# Patient Record
Sex: Male | Born: 1960 | Race: White | Hispanic: No | Marital: Married | Smoking: Former smoker
Health system: Southern US, Community
[De-identification: ages and names within clinical notes are randomized; demographics above are authoritative.]

## PROBLEM LIST (undated history)

## (undated) HISTORY — PX: SHOULDER SURGERY: SHX246

---

## 2000-07-23 ENCOUNTER — Emergency Department (HOSPITAL_COMMUNITY): Admission: EM | Admit: 2000-07-23 | Discharge: 2000-07-23 | Payer: Self-pay | Admitting: Emergency Medicine

## 2000-09-10 ENCOUNTER — Encounter: Payer: Self-pay | Admitting: Family Medicine

## 2000-09-10 ENCOUNTER — Encounter: Admission: RE | Admit: 2000-09-10 | Discharge: 2000-09-10 | Payer: Self-pay | Admitting: Family Medicine

## 2003-05-13 ENCOUNTER — Encounter: Payer: Self-pay | Admitting: Family Medicine

## 2003-05-13 ENCOUNTER — Encounter: Admission: RE | Admit: 2003-05-13 | Discharge: 2003-05-13 | Payer: Self-pay | Admitting: Family Medicine

## 2004-06-21 ENCOUNTER — Encounter: Admission: RE | Admit: 2004-06-21 | Discharge: 2004-06-21 | Payer: Self-pay | Admitting: Family Medicine

## 2005-10-01 IMAGING — CT CT HEAD W/O CM
1 series · 16 of 28 positions shown, 20 images · non-contrast
Comparison: none

CLINICAL DATA: Headaches. 
 HEAD CT WITHOUT CONTRAST
 Routine non-contrast head CT was performed. 

 There is no evidence of intracranial hemorrhage, brain edema, or mass effect. The ventricles are normal. No extra-axial abnormalities are identified. Bone windows show no significant abnormalities.
 IMPRESSION
 Negative non-contrast head CT.

[Series 2: brain · axial · 0.49mm/px · z∈[+36,+168]mm · 16 of 28 slices shown, 20 images]
[im 2/28  brain]
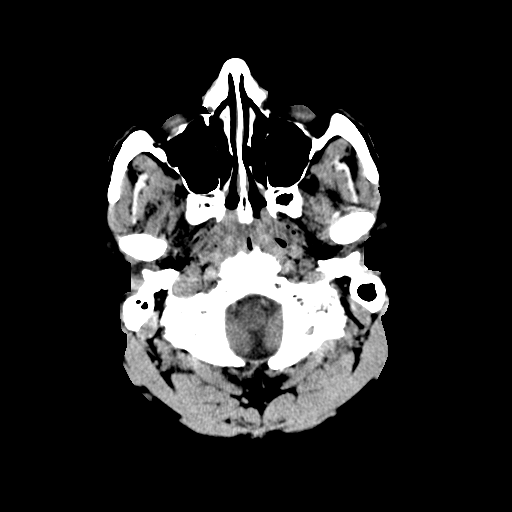
[im 2/28  bone]
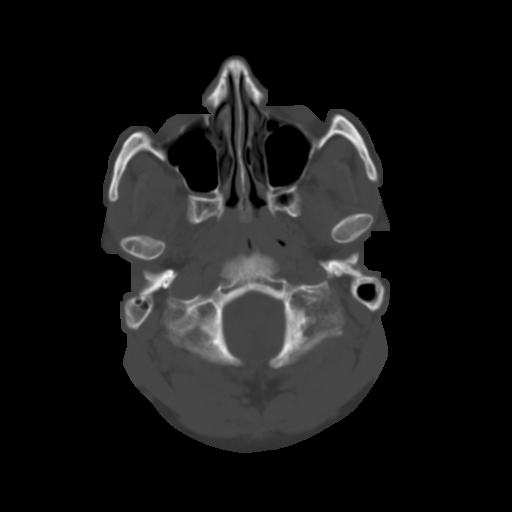
[im 4/28  brain]
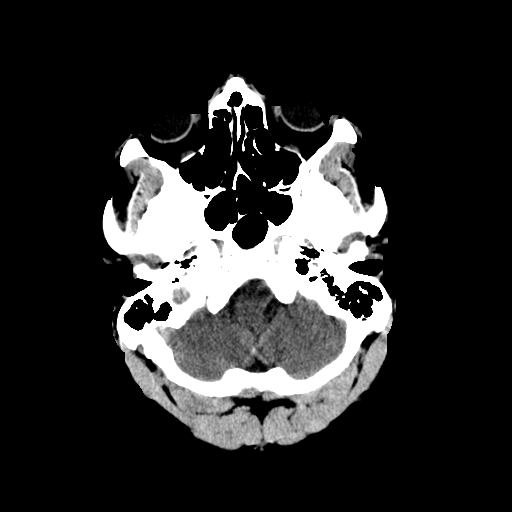
[im 6/28  brain]
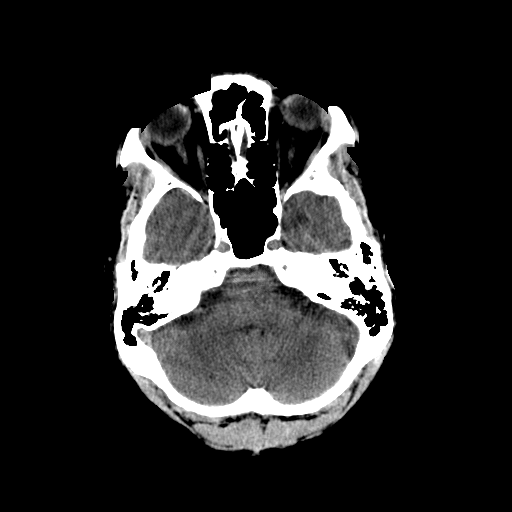
[im 7/28  brain]
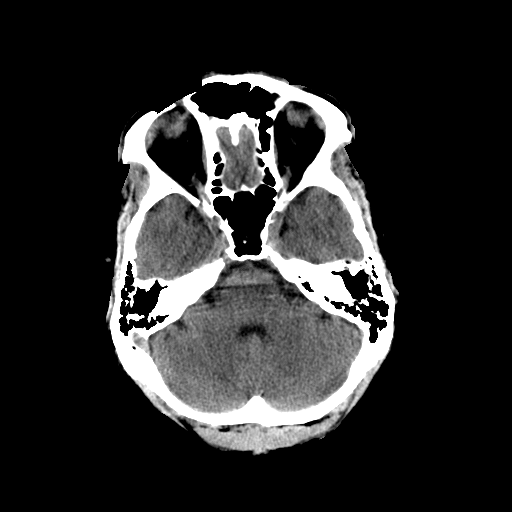
[im 9/28  brain]
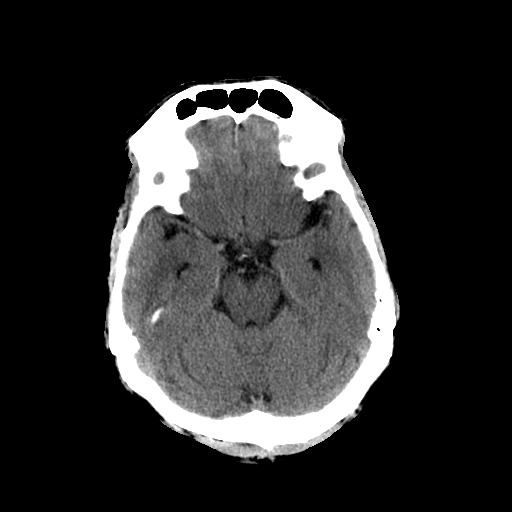
[im 9/28  bone]
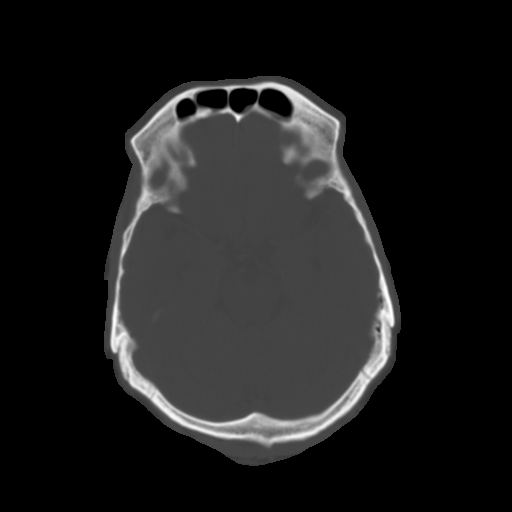
[im 10/28  brain]
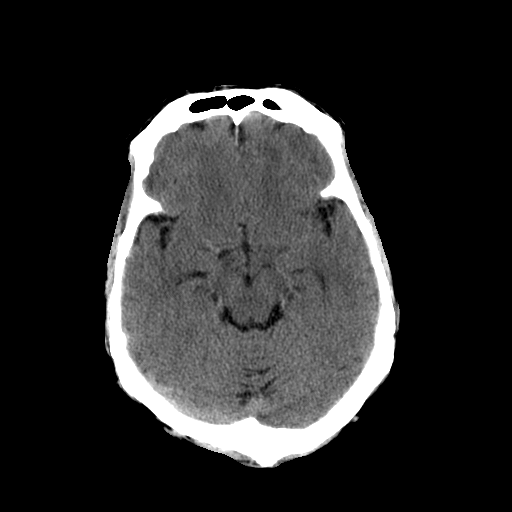
[im 12/28  brain]
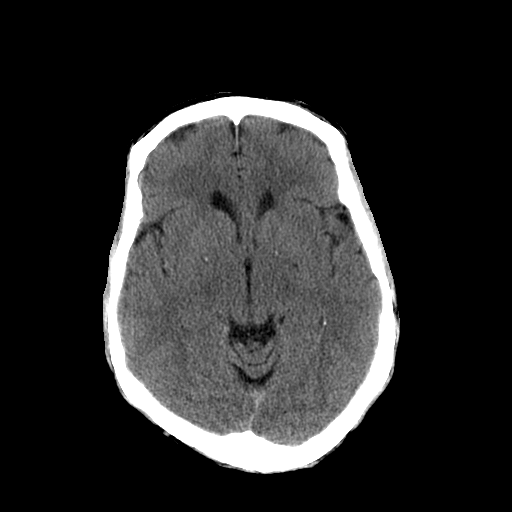
[im 14/28  brain]
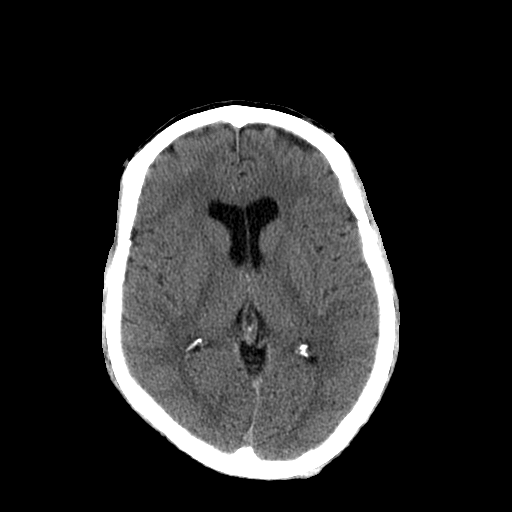
[im 15/28  brain]
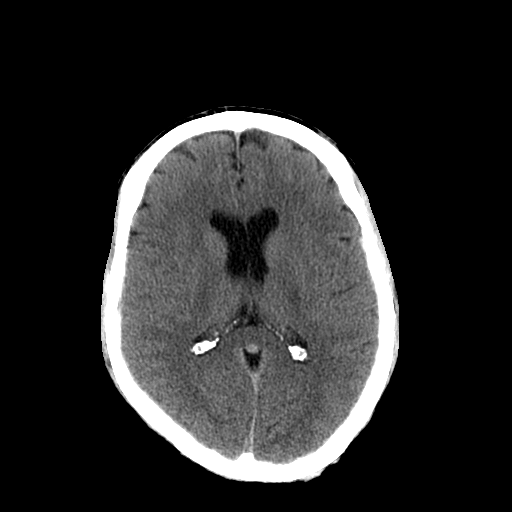
[im 15/28  bone]
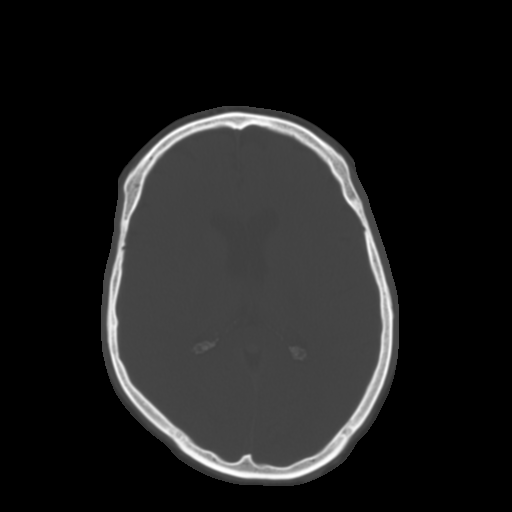
[im 17/28  brain]
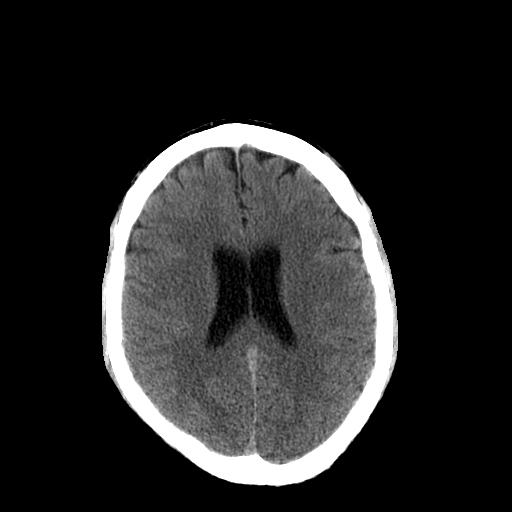
[im 19/28  brain]
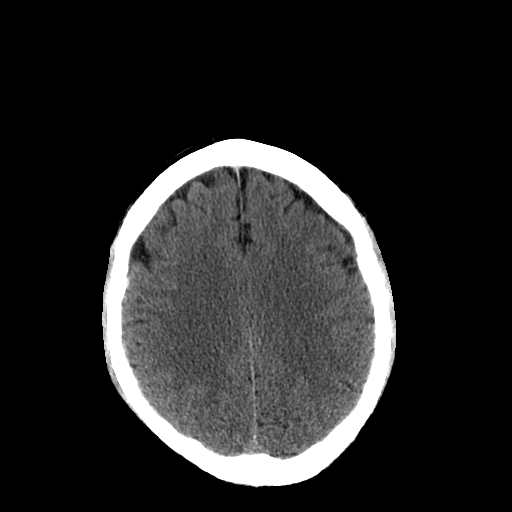
[im 20/28  brain]
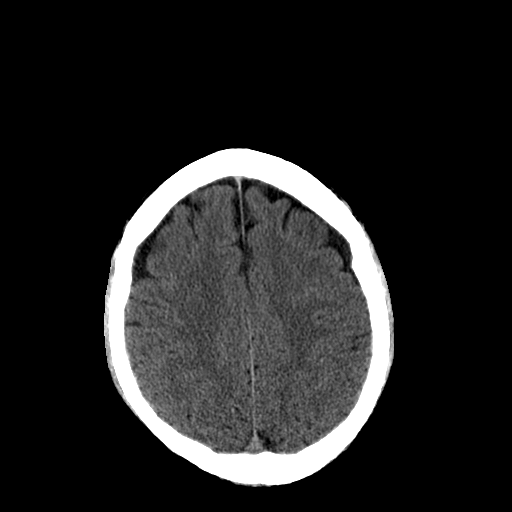
[im 22/28  brain]
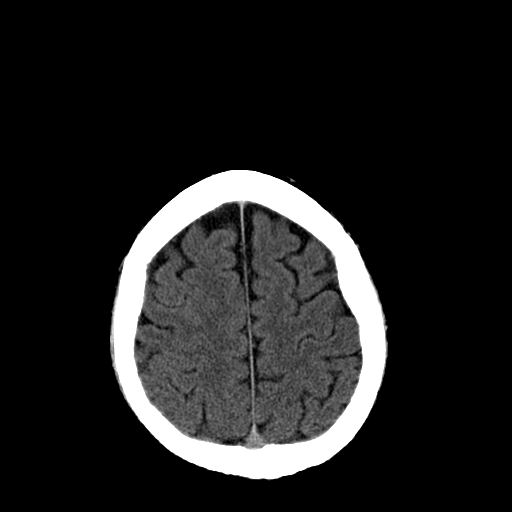
[im 22/28  bone]
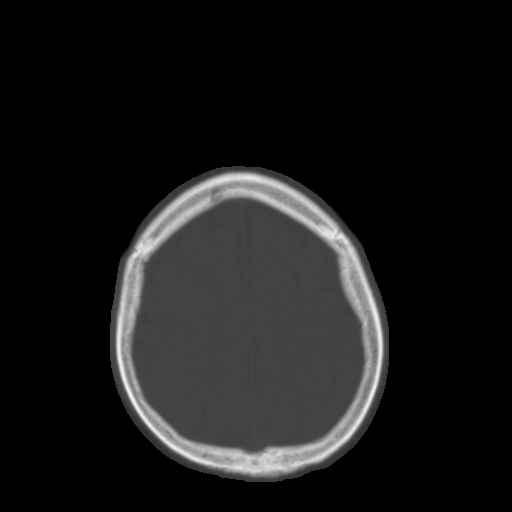
[im 23/28  brain]
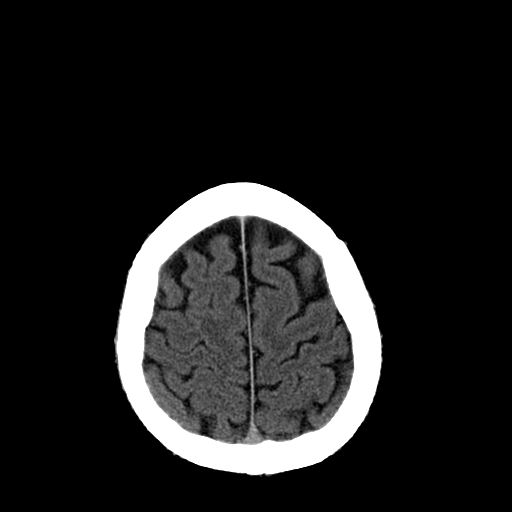
[im 25/28  brain]
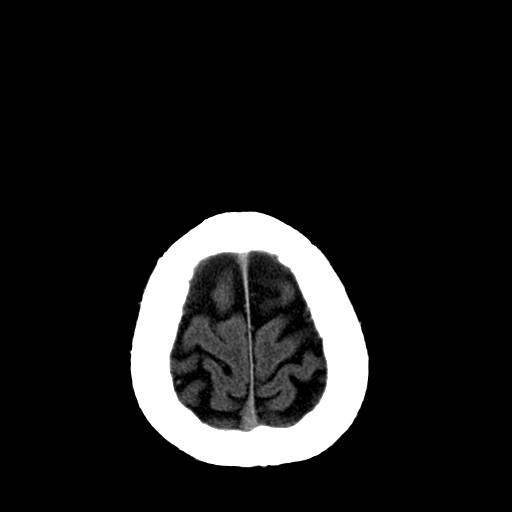
[im 27/28  brain]
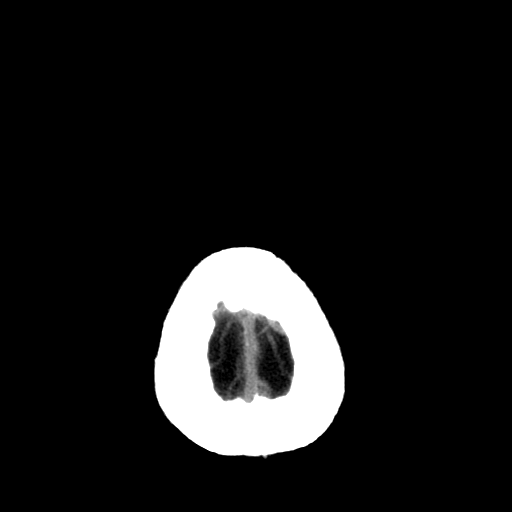

[16 of 28 positions shown; findings below may reference images not displayed]

## 2006-04-10 ENCOUNTER — Encounter: Admission: RE | Admit: 2006-04-10 | Discharge: 2006-04-10 | Payer: Self-pay | Admitting: Family Medicine

## 2007-07-21 IMAGING — CR DG ANKLE COMPLETE 3+V*R*
3 series · 3 of 3 positions shown · non-contrast
Comparison: None.

RIGHT ANKLE - 3  VIEW:

CLINICAL DATA: Pain without a known injury.

[view not recorded (1 of 3)]
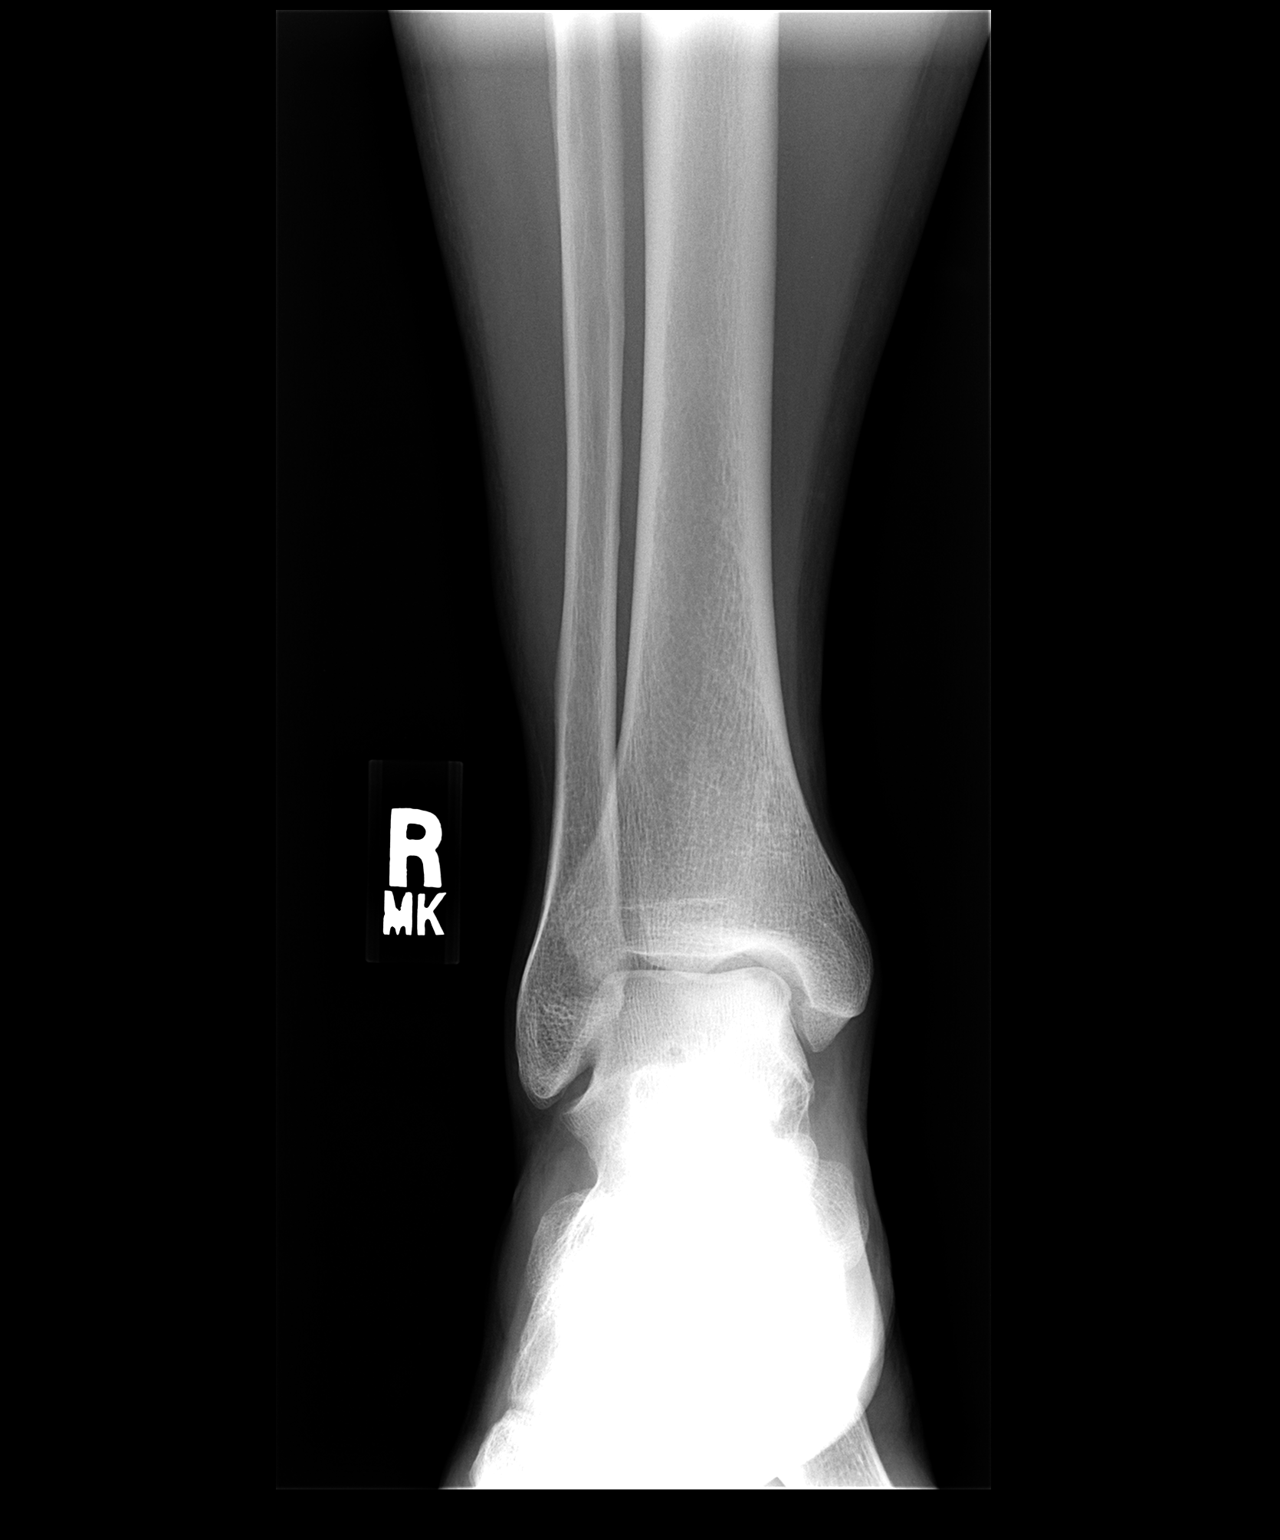

[view not recorded (2 of 3)]
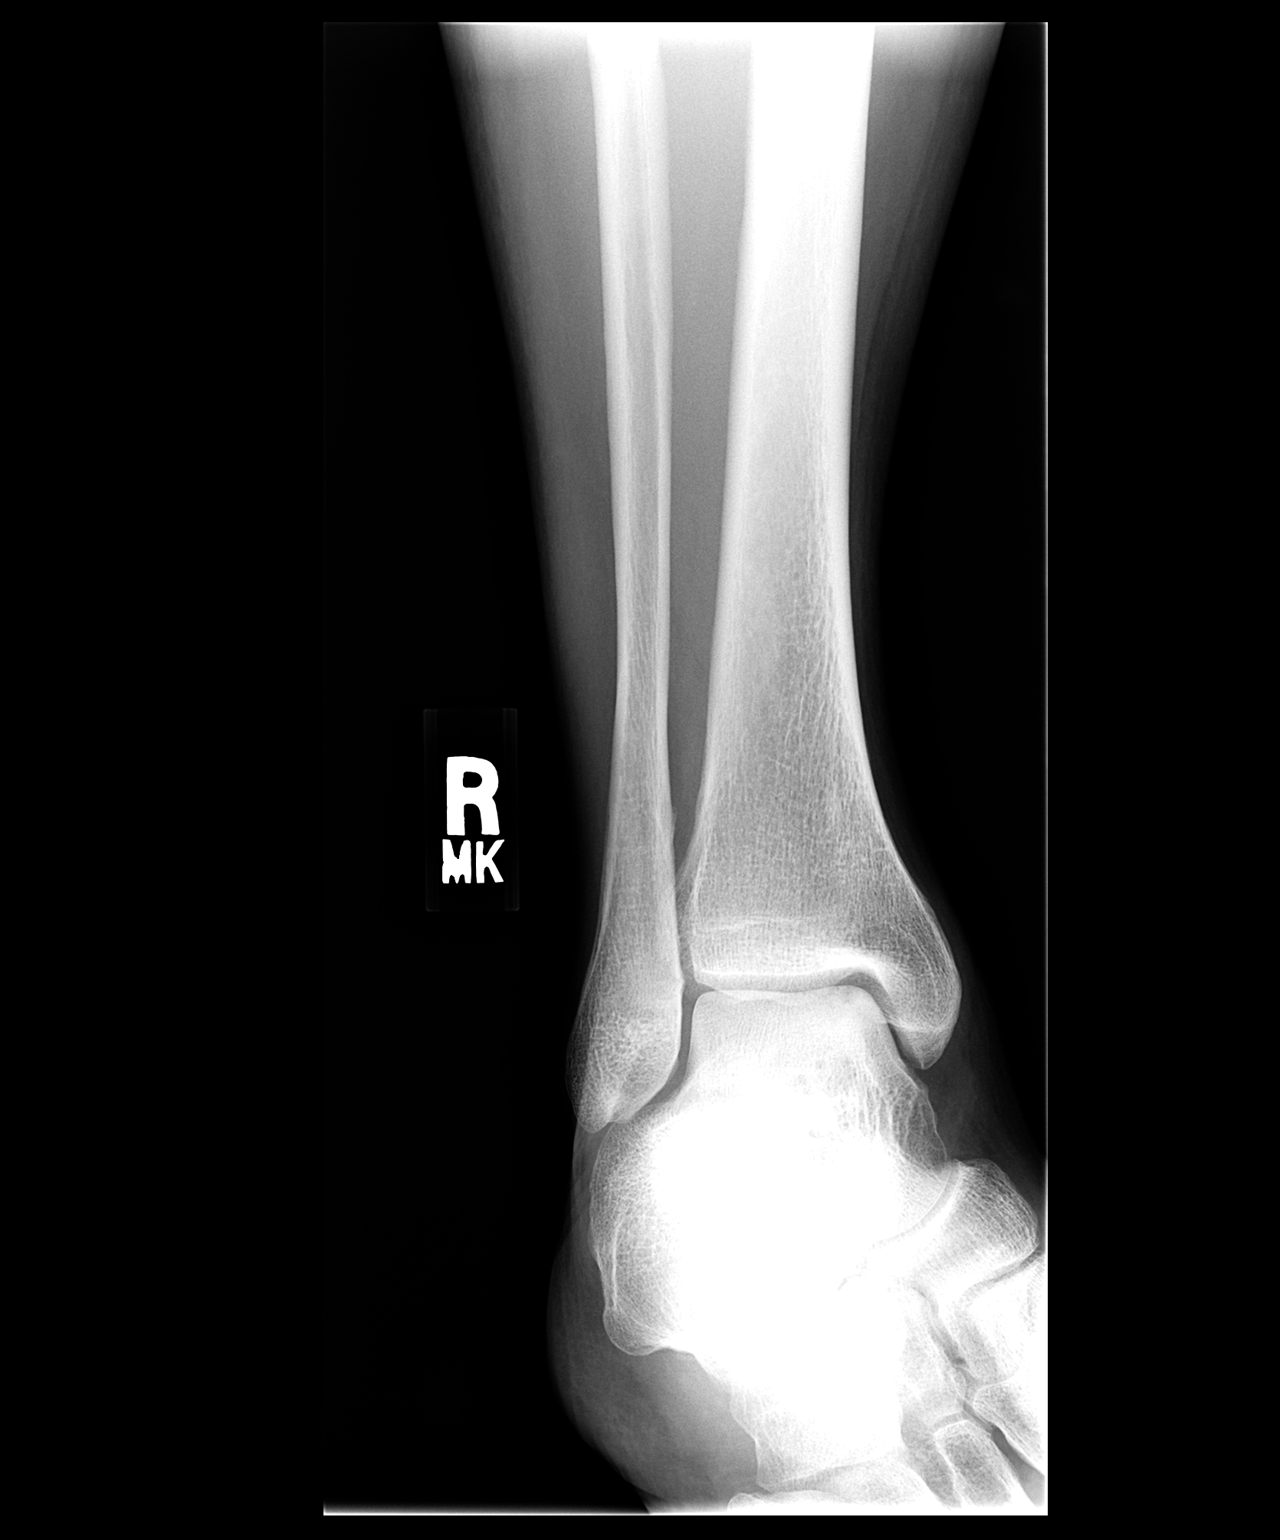

[view not recorded (3 of 3)]
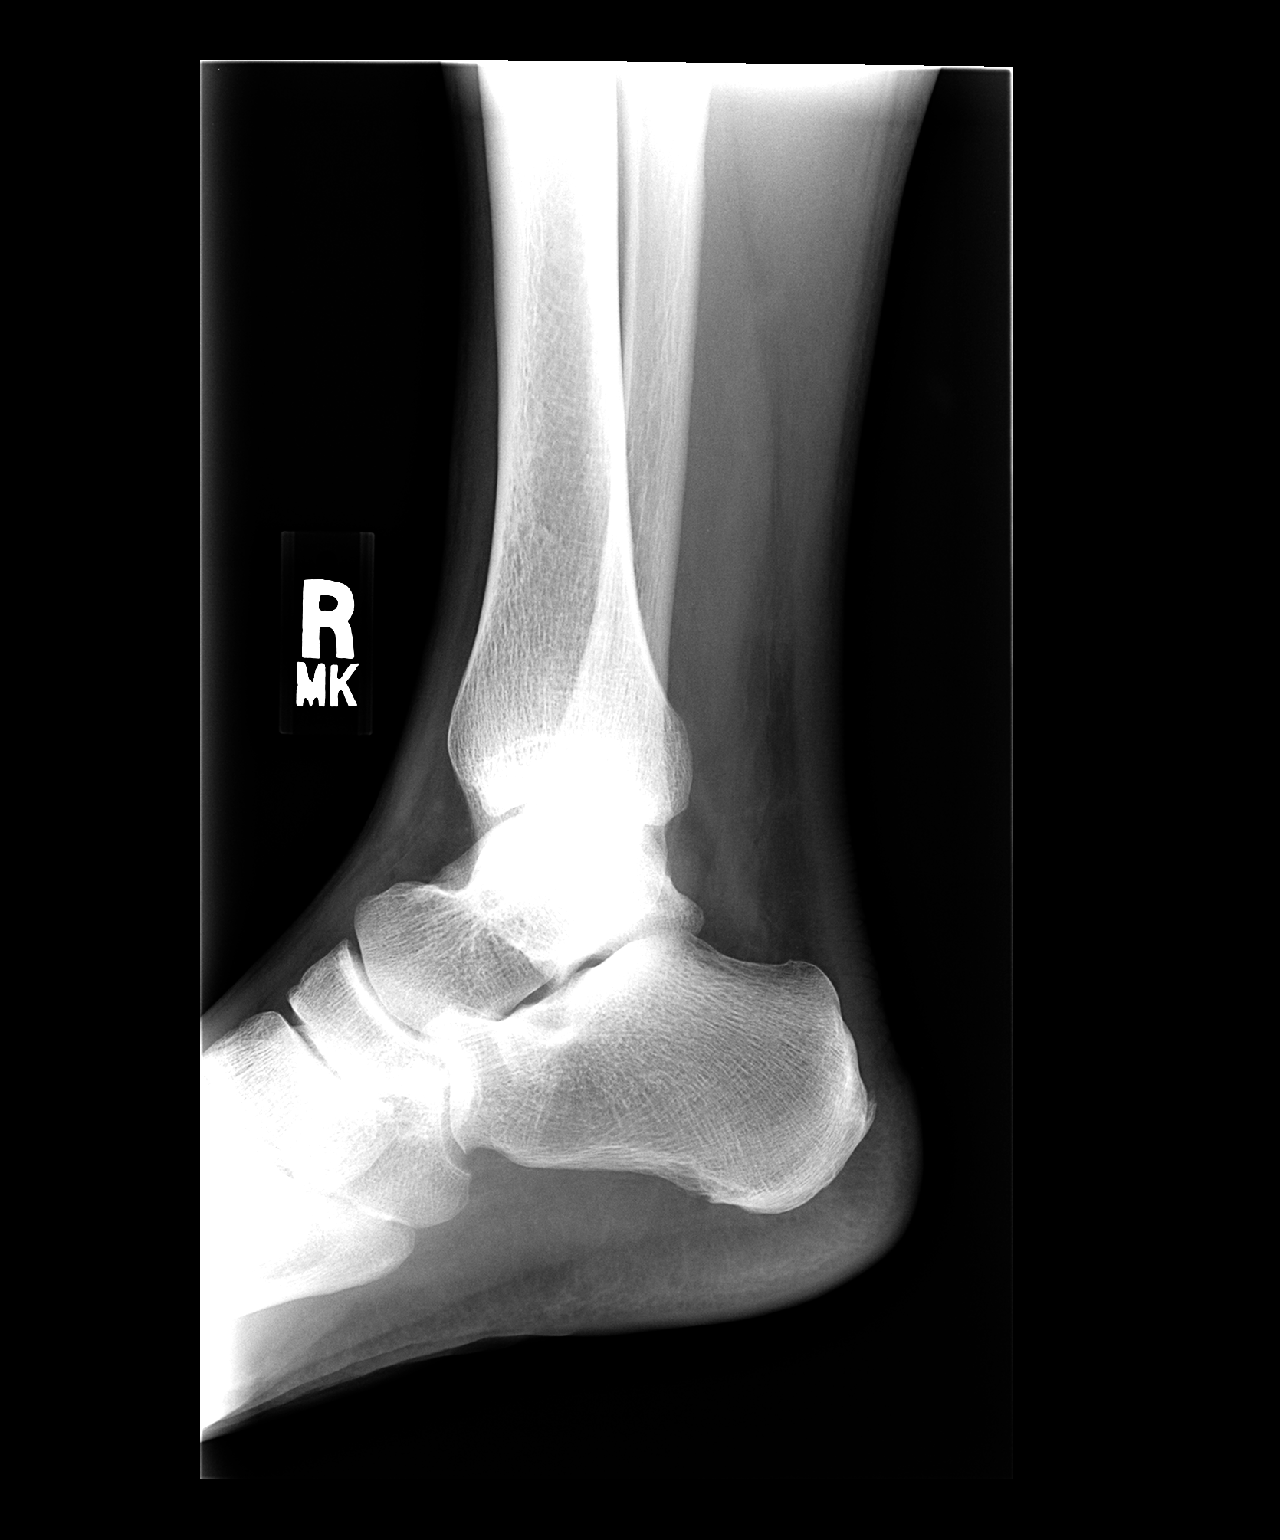

[3 of 3 positions shown; findings below may reference images not displayed]

FINDINGS: No evidence for acute fracture or dislocation. Bony alignment is
intact.  Overlying soft tissues are unremarkable.
IMPRESSION: No acute bony abnormality.

## 2007-12-25 ENCOUNTER — Encounter: Admission: RE | Admit: 2007-12-25 | Discharge: 2007-12-25 | Payer: Self-pay | Admitting: Family Medicine

## 2008-07-29 ENCOUNTER — Encounter: Admission: RE | Admit: 2008-07-29 | Discharge: 2008-07-29 | Payer: Self-pay | Admitting: Family Medicine

## 2009-04-05 IMAGING — US US CAROTID DUPLEX BILAT
1 series · 13 of 24 positions shown · non-contrast
Comparison: None available

CLINICAL DATA: BRUIT; ; TOBACCO USE

BILATERAL CAROTID DUPLEX ULTRASOUND
TECHNIQUE: Gray scale imaging, color Doppler and duplex ultrasound
was performed of bilateral carotid and vertebral arteries in the
neck.

[Series 1: us carotid duplex bilat · 0.09mm/px · 13 of 55 slices shown]
[im 1/55]
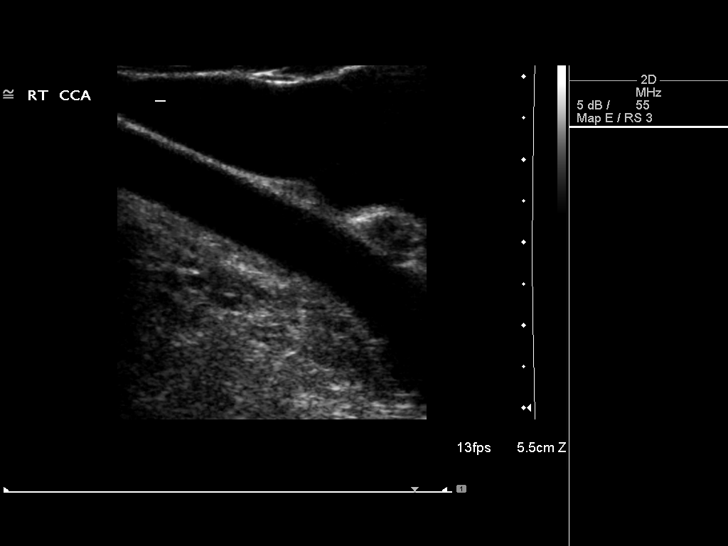
[im 5/55]
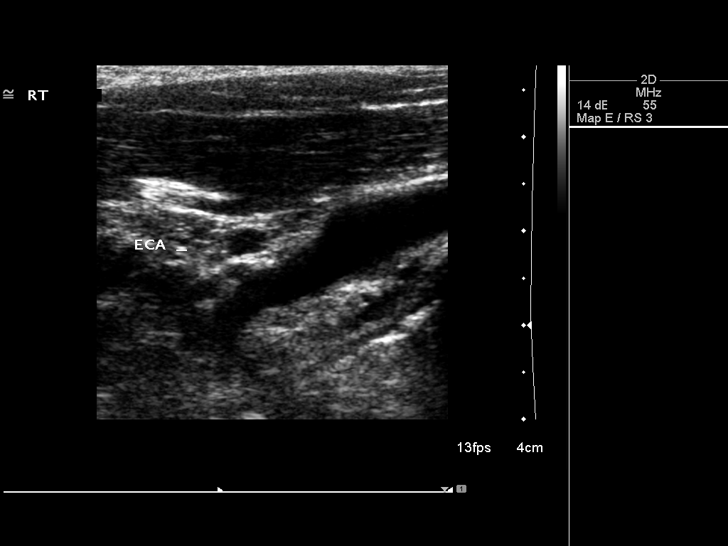
[im 10/55]
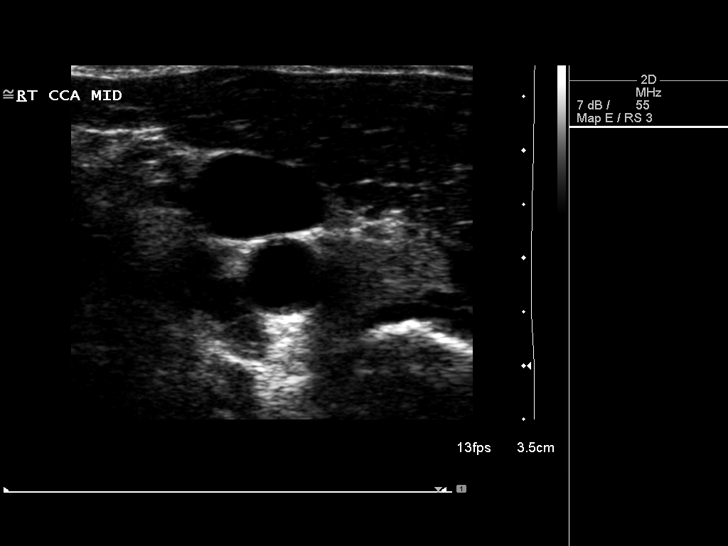
[im 15/55]
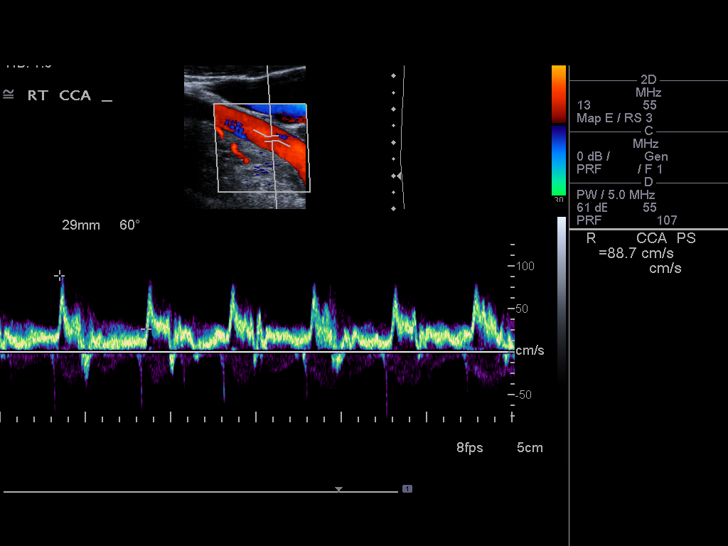
[im 19/55]
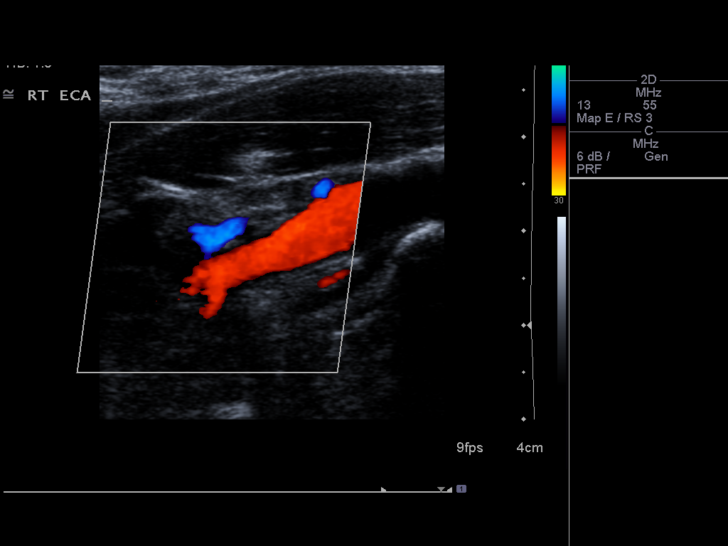
[im 24/55]
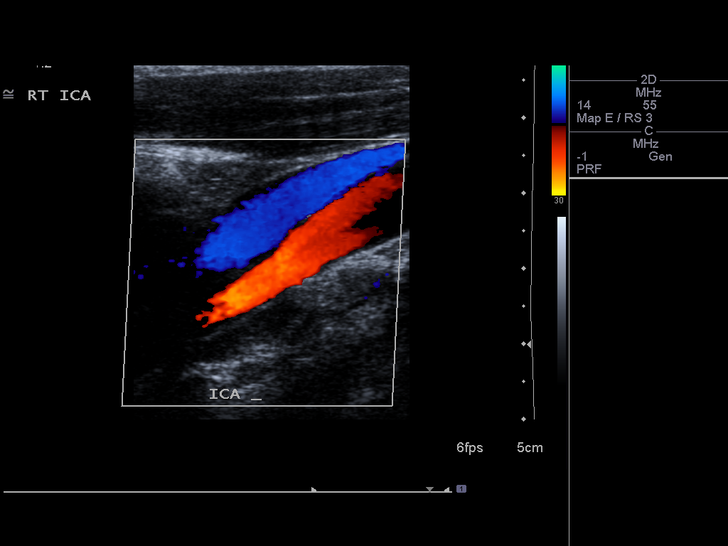
[im 29/55]
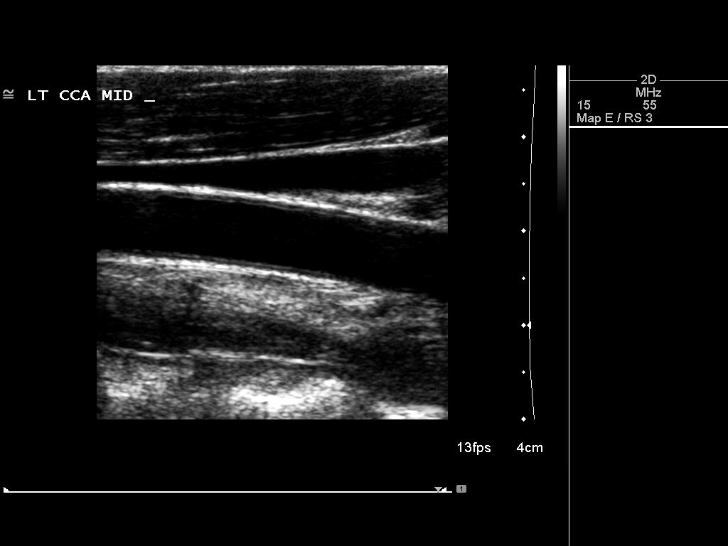
[im 31/55]
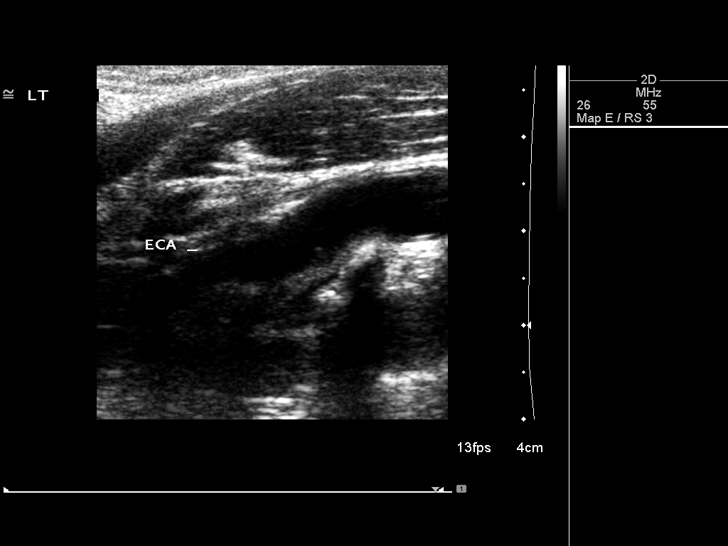
[im 36/55]
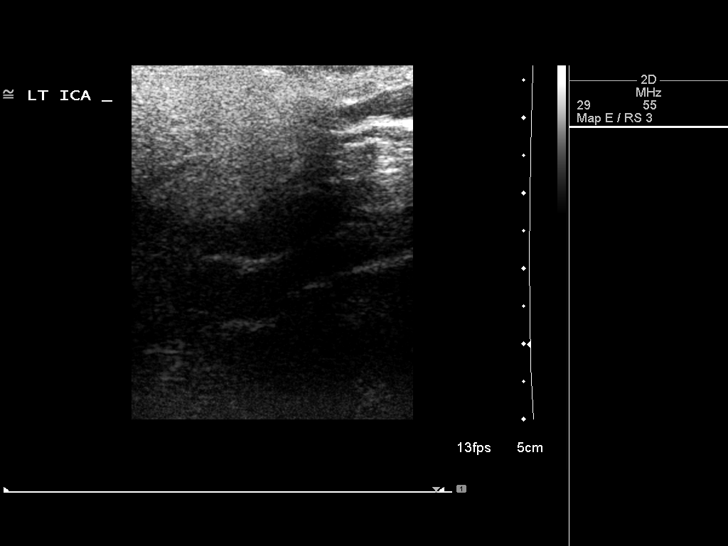
[im 40/55]
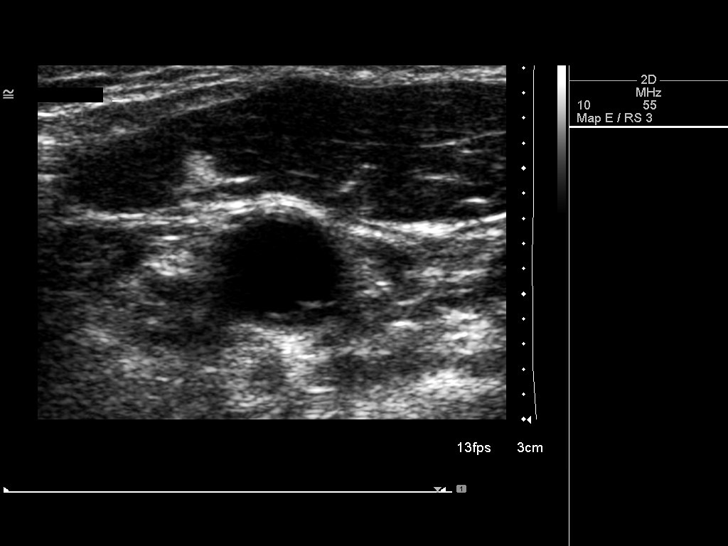
[im 45/55]
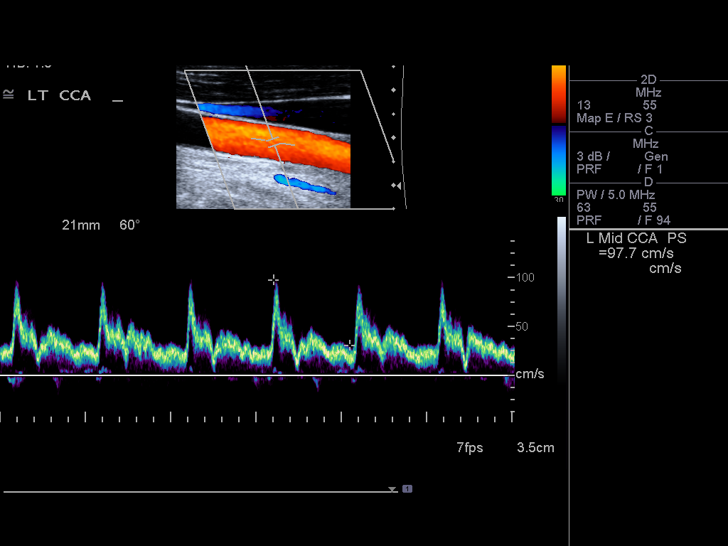
[im 50/55]
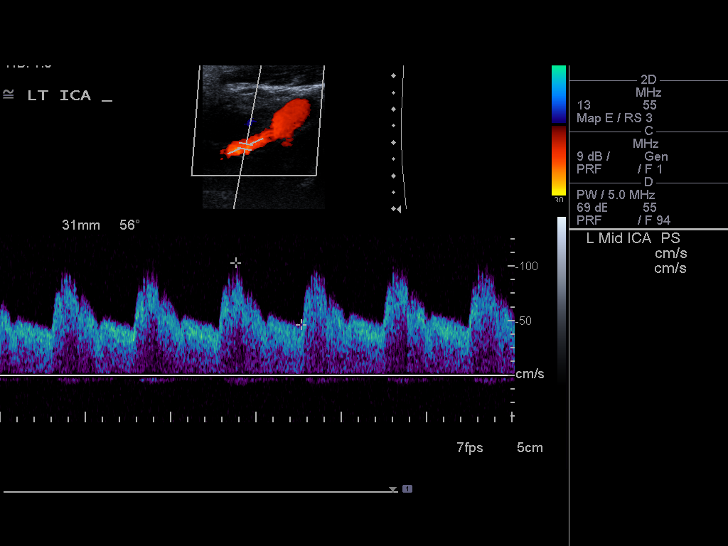
[im 55/55]
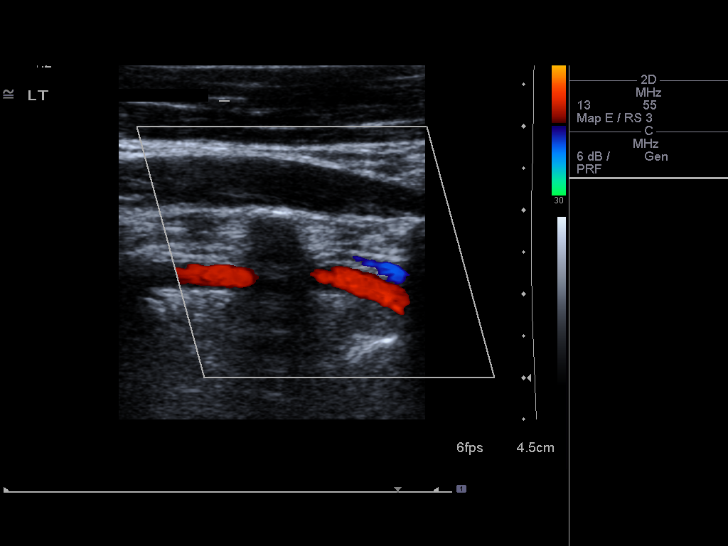

[13 of 24 positions shown; findings below may reference images not displayed]

Criteria:  Quantification of carotid stenosis is based on velocity
parameters that correlate the residual internal carotid diameter
with NASCET-based stenosis levels.

The following velocity measurements were obtained:

                 PEAK SYSTOLIC/END DIASTOLIC
RIGHT
ICA:                        97/54cm/sec
CCA:                        96/31cm/sec
SYSTOLIC ICA/CCA RATIO:
DIASTOLIC ICA/CCA RATIO:
ECA:                        81cm/sec

LEFT
ICA:                        103 / 46cm/sec
CCA:                        98/31cm/sec
SYSTOLIC ICA/CCA RATIO:
DIASTOLIC ICA/CCA RATIO:
ECA:                        75cm/sec
FINDINGS: RIGHT CAROTID ARTERY: Minimal   plaque in the carotid bulb and
proximal ICA without significant stenosis.

RIGHT VERTEBRAL ARTERY:  Normal flow direction and wave form

LEFT CAROTID ARTERY: Patchy plaque in the carotid bifurcation and
bulb without evidence of significant stenosis.

LEFT VERTEBRAL ARTERY:  Normal flow direction and wave form
IMPRESSION: 1.  Minimal bilateral carotid bifurcation plaque without
hemodynamically significant stenosis.  The exam does not exclude
plaque ulceration or embolization.  Continued surveillance
recommended.

## 2011-04-10 ENCOUNTER — Encounter: Payer: Self-pay | Admitting: Podiatry

## 2011-09-15 ENCOUNTER — Other Ambulatory Visit: Payer: Self-pay | Admitting: Gastroenterology

## 2011-09-15 DIAGNOSIS — R1032 Left lower quadrant pain: Secondary | ICD-10-CM

## 2011-09-20 ENCOUNTER — Ambulatory Visit
Admission: RE | Admit: 2011-09-20 | Discharge: 2011-09-20 | Disposition: A | Discharge status: Home / Self Care | Payer: 59 | Source: Ambulatory Visit | Attending: Gastroenterology | Admitting: Gastroenterology

## 2011-09-20 DIAGNOSIS — R1032 Left lower quadrant pain: Secondary | ICD-10-CM

## 2011-09-20 MED ORDER — IOHEXOL 300 MG/ML  SOLN
100.0000 mL | Freq: Once | INTRAMUSCULAR | Status: AC | PRN
  Administered 2011-09-20: 100 mL via INTRAVENOUS

## 2012-04-19 ENCOUNTER — Other Ambulatory Visit: Payer: Self-pay | Admitting: Family Medicine

## 2012-04-19 DIAGNOSIS — R599 Enlarged lymph nodes, unspecified: Secondary | ICD-10-CM

## 2012-04-24 ENCOUNTER — Ambulatory Visit
Admission: RE | Admit: 2012-04-24 | Discharge: 2012-04-24 | Disposition: A | Discharge status: Home / Self Care | Payer: 59 | Source: Ambulatory Visit | Attending: Family Medicine | Admitting: Family Medicine

## 2012-04-24 DIAGNOSIS — R599 Enlarged lymph nodes, unspecified: Secondary | ICD-10-CM

## 2012-04-24 MED ORDER — IOHEXOL 300 MG/ML  SOLN
75.0000 mL | Freq: Once | INTRAMUSCULAR | Status: AC | PRN
  Administered 2012-04-24: 75 mL via INTRAVENOUS

## 2014-06-08 ENCOUNTER — Encounter: Payer: Self-pay | Admitting: Cardiology

## 2014-06-08 ENCOUNTER — Ambulatory Visit (INDEPENDENT_AMBULATORY_CARE_PROVIDER_SITE_OTHER): Payer: 59 | Admitting: Cardiology

## 2014-06-08 VITALS — BP 130/88 | HR 64 | Ht 67.0 in | Wt 189.0 lb

## 2014-06-08 DIAGNOSIS — Z8249 Family history of ischemic heart disease and other diseases of the circulatory system: Secondary | ICD-10-CM

## 2014-06-08 DIAGNOSIS — Z87891 Personal history of nicotine dependence: Secondary | ICD-10-CM

## 2014-06-08 DIAGNOSIS — E785 Hyperlipidemia, unspecified: Secondary | ICD-10-CM

## 2014-06-08 NOTE — Progress Notes (Signed)
      [facility] [address], [address] [place], [place]  [address] Phone: [phone] Fax:  [phone]  Date:  06/08/2014   ID:  Cody Wilson, DOB Sep 04, 1960, MRN [id number]  PCP:  No primary provider on file.   History of Present Illness: Cody Wilson is a 53 y.o. male with hyperlipidemia, strong family history of coronary artery disease, quit tob 6 months ago. Here for prevention/possible coronary artery disease evaluation. Brother and sister with CAD. He is 3 first degree family members with heart disease. EKG recently perform shows sinus bradycardia otherwise normal. He takes Vytorin as well as fish oil. He denies any current chest pain, shortness of breath, syncope. EKG personally viewed.  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 On Sunday usually cuts the grass and does not have any difficulty with this.   In 2009 he had a carotid Doppler done which showed mild plaque bilaterally. I once again appreciated bruit left greater than right. I also here a soft heart murmur. Right upper sternal border.  Quit smoking 2014. Doing great.     Wt Readings from Last 3 Encounters:  06/08/14 189 lb (85.73 kg)     No past medical history on file.  No past surgical history on file.  Current Outpatient Prescriptions  Medication Sig Dispense Refill  . esomeprazole (NEXIUM) 40 MG capsule Take by mouth.      . ezetimibe-simvastatin (VYTORIN) 10-40 MG per tablet Take by mouth.      . Omega-3 Fatty Acids (FISH OIL) 1000 MG CAPS Take by mouth.       No current facility-administered medications for this visit.    Allergies:    Allergies  Allergen Reactions  . Other Nausea Only    ivp dye    Social History:  The patient  reports that he has quit smoking. He does not have any smokeless tobacco history on file.   No family history on file. brother and sister with early CAD.  ROS:  Please see the history of present illness.    No fevers, no chills, no syncope, no bleeding, no orthopnea   All other systems reviewed and negative.   PHYSICAL EXAM: VS:  BP 130/88  Pulse 64  Ht 5\' 7"  (1.702 m)  Wt 189 lb (85.73 kg)  BMI 29.59 kg/m2 Well nourished, well developed, in no acute distress HEENT: normal, [place]/AT, EOMI Neck: no JVD, normal carotid upstroke, no bruit Cardiac:  normal S1, S2; RRR; no murmur Lungs:  clear to auscultation bilaterally, no wheezing, rhonchi or rales Abd: soft, nontender, no hepatomegaly, no bruits Ext: no edema, 2+ distal pulses Skin: warm and dry GU: deferred Neuro: no focal abnormalities noted, AAO x 3  EKG: 06/08/14 - Sinus rhythm, 64, no other abnormalities     ASSESSMENT AND PLAN:  1. Cardiac prevention-doing well, he has stopped smoking. 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 Trying to lose weight. He more fruits and vegetables. 2. Family history of CAD-as above 3. Hyperlipidemia-LDL 90 on 02/2014. Good control on Vytorin. 4. One-year followup  Signed, Candee Furbish, MD [facility]  06/08/2014 3:23 PM

## 2014-06-08 NOTE — Patient Instructions (Signed)
The current medical regimen is effective;  continue present plan and medications.  Follow up in 1 year with Dr Skains.  You will receive a letter in the mail 2 months before you are due.  Please call us when you receive this letter to schedule your follow up appointment.  

## 2015-06-03 ENCOUNTER — Encounter: Payer: Self-pay | Admitting: Cardiology

## 2015-06-09 ENCOUNTER — Encounter: Payer: Self-pay | Admitting: Nurse Practitioner

## 2015-06-09 ENCOUNTER — Ambulatory Visit (INDEPENDENT_AMBULATORY_CARE_PROVIDER_SITE_OTHER): Payer: 59 | Admitting: Nurse Practitioner

## 2015-06-09 VITALS — BP 148/100 | HR 58 | Ht 67.0 in | Wt 185.1 lb

## 2015-06-09 DIAGNOSIS — Z8249 Family history of ischemic heart disease and other diseases of the circulatory system: Secondary | ICD-10-CM | POA: Diagnosis not present

## 2015-06-09 DIAGNOSIS — Z87891 Personal history of nicotine dependence: Secondary | ICD-10-CM

## 2015-06-09 DIAGNOSIS — E785 Hyperlipidemia, unspecified: Secondary | ICD-10-CM | POA: Diagnosis not present

## 2015-06-09 NOTE — Progress Notes (Signed)
CARDIOLOGY OFFICE NOTE  Date:  06/09/2015    [name] Date of Birth: Nov 30, 1960 Medical Record #[id number]  PCP:  Donnie Coffin, MD  Cardiologist:  [facility]    Chief Complaint  Patient presents with  . Hyperlipidemia    1 year check - seen for Dr. Marlou Porch    History of Present Illness: Cody Wilson is a 54 y.o. male who presents today for a one year check. He has a history of hyperlipidemia, strong family history of coronary artery disease, and past tobacco abuse. Here for prevention/possible coronary artery disease evaluation. Brother and sister with CAD. He has 3 first degree family members with heart disease. He has had a murmur. Tells me he has had prior echo back when Dr. Marlou Porch was with [facility].   Last seen a year ago and was felt to be doing well.  Comes back today. Here alone. Doing ok. No real complaints. No chest pain. Breathing ok. Not smoking. Staying active. BP up today. He is pretty adamant that he does not have a BP problem. Not clear how much salt he uses. Mom has HTN. He is not smoking. Labs are checked by his PCP - Dr. Alroy Dust.   No past medical history on file.  Past Surgical History  Procedure Laterality Date  . Shoulder surgery       Medications: Current Outpatient Prescriptions  Medication Sig Dispense Refill  . esomeprazole (NEXIUM) 40 MG capsule Take by mouth.    . ezetimibe-simvastatin (VYTORIN) 10-40 MG per tablet Take by mouth.    . Omega-3 Fatty Acids (FISH OIL) 1000 MG CAPS Take by mouth.     No current facility-administered medications for this visit.    Allergies: Allergies  Allergen Reactions  . Other Nausea Only    ivp dye    Social History: The patient  reports that he has quit smoking. He does not have any smokeless tobacco history on file. He reports that he does not drink alcohol or use illicit drugs.   Family History: The patient's family history includes Heart Problems in his father; Hypertension in his mother.    Review of Systems: Please see the history of present illness.   Otherwise, the review of systems is positive for none.   All other systems are reviewed and negative.   Physical Exam: VS:  BP 148/100 mmHg  Pulse 58  Ht 5\' 7"  (1.702 m)  Wt 185 lb 1.9 oz (83.97 kg)  BMI 28.99 kg/m2 .  BMI Body mass index is 28.99 kg/(m^2).  Wt Readings from Last 3 Encounters:  06/09/15 185 lb 1.9 oz (83.97 kg)  06/08/14 189 lb (85.73 kg)   BP by me is 120/90  General: Alert. Well developed, well nourished and in no acute distress.  HEENT: Normal. Neck: Supple, no JVD, carotid bruits, or masses noted.  Cardiac: Regular rate and rhythm. Soft outflow murmur. No edema.  Respiratory:  Lungs are clear to auscultation bilaterally with normal work of breathing.  GI: Soft and nontender.  MS: No deformity or atrophy. Gait and ROM intact. Skin: Warm and dry. Color is normal.  Neuro:  Strength and sensation are intact and no gross focal deficits noted.  Psych: Alert, appropriate and with normal affect.   LABORATORY DATA:  EKG:  EKG is ordered today. This demonstrates sinus bradycardia.  No results found for: WBC, HGB, HCT, PLT, GLUCOSE, CHOL, TRIG, HDL, LDLDIRECT, LDLCALC, ALT, AST, NA, K, CL, CREATININE, BUN, CO2, TSH, PSA, INR, GLUF,  HGBA1C, MICROALBUR  BNP (last 3 results) No results for input(s): BNP in the last 8760 hours.  ProBNP (last 3 results) No results for input(s): PROBNP in the last 8760 hours.   Other Studies Reviewed Today:   Assessment/Plan: 1. Cardiac prevention-doing well, he has stopped smoking.  2. Family history of CAD-as above 3. Hyperlipidemia - on Vytorin.His labs are checked by PCP 4. Elevated BP - recheck by me is 120/90 - I have asked him to monitor his BP at home and call by the end of the month his readings.  5. Murmur - tells me he has had prior echo. Will try to get scanned into his chart.  Current medicines are reviewed with the patient today.  The patient does  not have concerns regarding medicines other than what has been noted above.  The following changes have been made:  See above.  Labs/ tests ordered today include:   No orders of the defined types were placed in this encounter.     Disposition:   FU with Dr. Marlou Porch in 1 year.   Patient is agreeable to this plan and will call if any problems develop in the interim.   Signed: Burtis Junes, RN, ANP-C 06/09/2015 1:34 PM  [facility] [address] [place], [place]  [address] Phone: [phone] Fax: [phone]

## 2015-06-09 NOTE — Patient Instructions (Addendum)
We will be checking the following labs today - NONE   Medication Instructions:    Continue with your current medicines.     Testing/Procedures To Be Arranged:  N/A  Follow-Up:   See Dr. Marlou Porch in one year.     Other Special Instructions:   Monitor your blood pressure daily for the next one to two weeks and call Dr. Kingsley Plan nurse Jeannene Patella with an update by the end of the month.   Call the [place] office at [phone] if you have any questions, problems or concerns.

## 2015-06-15 ENCOUNTER — Encounter: Payer: Self-pay | Admitting: Nurse Practitioner

## 2015-06-29 ENCOUNTER — Telehealth: Payer: Self-pay | Admitting: Nurse Practitioner

## 2015-06-29 NOTE — Telephone Encounter (Signed)
New message      Pt c/o BP issue: STAT if pt c/o blurred vision, one-sided weakness or slurred speech  1. What are your last 5 BP readings? 10-17 107/80, 10/18 121/78,  10-19 115/78, 10-20 125/81, 10-21 117/78, 10-22 119/82, 10-23 116/78, 10-24 113/75,   2. Are you having any other symptoms (ex. Dizziness, headache, blurred vision, passed out)? no  3. What is your BP issue? Calling to give bp readings

## 2015-06-29 NOTE — Telephone Encounter (Signed)
Excellent BP readings.  Candee Furbish, MD

## 2015-06-29 NOTE — Telephone Encounter (Signed)
Pt was seen recently by Truitt Merle, NP and was to call back with BP readings.  Will forward to Dr. Marlou Porch for review.

## 2015-07-01 NOTE — Telephone Encounter (Signed)
Left message for pt on private voicemail that BP's have been reviewed by Dr Marlou Porch and are excellent.  Instructed pt to continue current treatment plan without any changes and to call back with questions or concerns.

## 2015-11-04 ENCOUNTER — Ambulatory Visit (INDEPENDENT_AMBULATORY_CARE_PROVIDER_SITE_OTHER): Payer: 59 | Admitting: Podiatry

## 2015-11-04 ENCOUNTER — Encounter: Payer: Self-pay | Admitting: Podiatry

## 2015-11-04 ENCOUNTER — Ambulatory Visit (INDEPENDENT_AMBULATORY_CARE_PROVIDER_SITE_OTHER): Payer: 59

## 2015-11-04 VITALS — BP 144/86 | HR 66 | Resp 16

## 2015-11-04 DIAGNOSIS — M722 Plantar fascial fibromatosis: Secondary | ICD-10-CM

## 2015-11-04 DIAGNOSIS — L603 Nail dystrophy: Secondary | ICD-10-CM | POA: Diagnosis not present

## 2015-11-04 MED ORDER — METHYLPREDNISOLONE 4 MG PO TBPK: ORAL_TABLET | ORAL | Status: DC

## 2015-11-04 NOTE — Progress Notes (Signed)
   Subjective:    Patient ID: Cody Wilson, male    DOB: August 02, 1961, 55 y.o.   MRN: [id number]  HPI: He presents today with a chief complaint of painful right heel. He says thing going on for quite some time. He has had plantar fasciitis before and would like to consider EPAT therapy. He is also concerned about discoloration his toenails.      Review of Systems  Musculoskeletal: Positive for myalgias, arthralgias and gait problem.  All other systems reviewed and are negative.      Objective:   Physical Exam: Vital signs stable alert and oriented 3 pulses are palpable. Neurologic sensorium is intact. Deep tendon reflexes are intact. Muscle strength is intact. Cutaneous evaluation is intact with no open lesions or wounds. 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 Orthopedic evaluation does demonstrate all joints is like a full range of motion without crepitus has pain on palpation medial acute tubercle of the right heel.        Assessment & Plan:  Assessment: Nail dystrophy. Plantar fasciitis right foot.  Plan: To sample of the nails today will follow up with him once those come in. He would like to set up EPAT treatments. Follow up with him in the near future.

## 2015-11-23 ENCOUNTER — Encounter: Payer: Self-pay | Admitting: Podiatry

## 2015-11-23 ENCOUNTER — Ambulatory Visit (INDEPENDENT_AMBULATORY_CARE_PROVIDER_SITE_OTHER): Payer: 59 | Admitting: Podiatry

## 2015-11-23 DIAGNOSIS — M722 Plantar fascial fibromatosis: Secondary | ICD-10-CM

## 2015-11-23 DIAGNOSIS — L603 Nail dystrophy: Secondary | ICD-10-CM | POA: Diagnosis not present

## 2015-11-23 DIAGNOSIS — Z79899 Other long term (current) drug therapy: Secondary | ICD-10-CM | POA: Diagnosis not present

## 2015-11-23 NOTE — Patient Instructions (Signed)

## 2015-11-23 NOTE — Progress Notes (Signed)
Mian presents today for follow-up of his plantar fasciitis right foot. He states that it is absolutely killing him today and he is going to have to do something about it. He states that the medication we gave him did not work at all. He is also here for the repeat of his pathology from his nail sample.  Objective: Vital signs are stable alert and oriented 3 severe pain on palpation medial calcaneal tubercle of the right heel. Pathology report does demonstrate yeast infection for which Sporanox is indicated.  Assessment: Plantar fasciitis chronic in nature right. Yeast infection toenail hallux right.  Plan: Discussed etiology pathology conservative versus surgical therapies. Sporanox is on back order we will notify him when we are able to utilize oral therapy. Also I injected his right heel today with Kenalog and local anesthetic and set him up for EPAT.

## 2015-11-29 ENCOUNTER — Telehealth: Payer: Self-pay | Admitting: *Deleted

## 2015-11-29 NOTE — Telephone Encounter (Signed)
Dr. Milinda Pointer reviewed fungal culture 11/04/2015 as +, request make an appt.  Left message to schedule an appt to discuss treatment.

## 2015-12-02 ENCOUNTER — Ambulatory Visit (INDEPENDENT_AMBULATORY_CARE_PROVIDER_SITE_OTHER): Payer: 59

## 2015-12-02 DIAGNOSIS — B351 Tinea unguium: Secondary | ICD-10-CM

## 2015-12-02 DIAGNOSIS — M722 Plantar fascial fibromatosis: Secondary | ICD-10-CM

## 2015-12-02 NOTE — Progress Notes (Signed)
   Subjective:    Patient ID: Cody Wilson, male    DOB: July 05, 1961, 55 y.o.   MRN: [id number]  HPI Pt presents for EPAT #1 treatment of his right foot. States that his right heel is sore   Review of Systems    All other systems negative Objective:   Physical Exam       Pain upon deep palpation of the center and medial band of the heel right foot  Assessment & Plan:  Shockwave therapy delivered to medial band of right heel at 17hz  4.0 3000 pulses. Plantar aspect center heel was treated at 17hz  4.2 for 1500 pulses. General treatment all over the heel was delivered at 17hz  3.5 3000 pulses. Pt tolerated treatment well. Advised against use of ani-inflammatories. Re- appointed in 1 week for 2nd treatment.

## 2015-12-03 ENCOUNTER — Encounter: Payer: Self-pay | Admitting: Podiatry

## 2015-12-09 ENCOUNTER — Ambulatory Visit: Payer: 59

## 2015-12-09 DIAGNOSIS — B351 Tinea unguium: Secondary | ICD-10-CM

## 2015-12-09 DIAGNOSIS — M722 Plantar fascial fibromatosis: Secondary | ICD-10-CM

## 2015-12-09 NOTE — Progress Notes (Signed)
   Subjective:    Patient ID: Cody Wilson, male    DOB: 26-Aug-1961, 55 y.o.   MRN: [id number]  HPI Pt presents for EPAT #2 treatment of his right foot. States that his right heel is sore   Review of Systems    All other systems negative Objective:   Physical Exam       Pain upon deep palpation of the center and medial band of the heel right foot  Assessment & Plan:  Shockwave therapy delivered to medial band of right heel at 17hz  4.0 3000 pulses. Plantar aspect center heel was treated at 17hz  4.2 for 1500 pulses. General treatment all over the heel was delivered at 17hz  4.4 at  3000 pulses. Pt tolerated treatment well. Advised against use of ani-inflammatories. Re- appointed in 1 week for 3rd  treatment.

## 2015-12-16 ENCOUNTER — Ambulatory Visit (INDEPENDENT_AMBULATORY_CARE_PROVIDER_SITE_OTHER): Payer: 59

## 2015-12-16 DIAGNOSIS — M722 Plantar fascial fibromatosis: Secondary | ICD-10-CM

## 2015-12-16 DIAGNOSIS — B351 Tinea unguium: Secondary | ICD-10-CM

## 2015-12-16 NOTE — Progress Notes (Signed)
   Subjective:    Patient ID: Cody Wilson, male    DOB: [birth year], 55 y.o.   MRN: [id number]  HPI Pt presents for EPAT #3 treatment of his right foot. States that his right heel is sore at the end of the day, after standing on it all day   Review of Systems    All other systems negative Objective:   Physical Exam       Pain upon deep palpation of the center and medial band of the heel right foot  Assessment & Plan:  Shockwave therapy delivered to medial band of right heel at 17hz  4.0 3000 pulses. Plantar aspect center heel was treated at 17hz  4.2 for 1500 pulses. General treatment all over the heel was delivered at 17hz  4.4 at  3000 pulses. Pt tolerated treatment well. Advised against use of ani-inflammatories. Re- appointed to see Dr Milinda Pointer in 4 weeks for re-evaluation and 4th treatment

## 2016-01-13 ENCOUNTER — Ambulatory Visit (INDEPENDENT_AMBULATORY_CARE_PROVIDER_SITE_OTHER): Payer: 59

## 2016-01-13 DIAGNOSIS — M722 Plantar fascial fibromatosis: Secondary | ICD-10-CM

## 2016-01-13 NOTE — Progress Notes (Signed)
   Subjective:    Patient ID: Cody Wilson, male    DOB: 11-24-1960, 55 y.o.   MRN: [id number]  HPI Pt presents for EPAT #4 treatment of his right foot. States that his right heel is sore at the end of the day, after standing on it all day   Review of Systems    All other systems negative Objective:   Physical Exam       Pain upon deep palpation of the center and medial band of the heel right foot  Assessment & Plan:  Shockwave therapy delivered to medial band of right heel using ESWR  At 30.5 joules 0.45 energy for 3000 pulses. EPAT was administered to surrounding connective tissues. Pt tolerated treatment well. Did experience intermittent pain during procedure ranging from 3 -8. Re-appointed as needed if symptoms persist

## 2016-01-31 ENCOUNTER — Encounter: Payer: Self-pay | Admitting: Podiatry

## 2016-01-31 ENCOUNTER — Ambulatory Visit: Payer: 59

## 2016-01-31 ENCOUNTER — Telehealth: Payer: Self-pay

## 2016-01-31 DIAGNOSIS — M722 Plantar fascial fibromatosis: Secondary | ICD-10-CM

## 2016-01-31 NOTE — Telephone Encounter (Signed)
Left VM for pt to come in today to receive ESWT therapy, or call to schedule at his convienence

## 2016-01-31 NOTE — Progress Notes (Signed)
   Subjective:    Patient ID: Cody Wilson, male    DOB: 13-Jun-1961, 55 y.o.   MRN: [id number]  HPI Pt presents for ESWT#2, shock wave #5 treatment of his right foot. States that his right heel is sore at the end of the day, after standing on it all day   Review of Systems    All other systems negative Objective:   Physical Exam       Pain upon deep palpation of the center and medial band of the heel right foot  Assessment & Plan:  Shockwave therapy delivered to medial band of right heel using ESWR  At 32.5 joules 0.45 energy for 3000 pulses. EPAT was administered to surrounding connective tissues. Pt tolerated treatment well. Did experience intermittent pain during procedure ranging from 3 -8. Re-appointed to follow up with Dr Milinda Pointer in 4 weeks for re-evaluation

## 2016-02-01 ENCOUNTER — Ambulatory Visit: Payer: 59 | Admitting: Podiatry

## 2016-03-02 ENCOUNTER — Ambulatory Visit (INDEPENDENT_AMBULATORY_CARE_PROVIDER_SITE_OTHER): Payer: 59 | Admitting: Podiatry

## 2016-03-02 ENCOUNTER — Encounter: Payer: Self-pay | Admitting: Podiatry

## 2016-03-02 VITALS — BP 152/62 | HR 88 | Resp 12

## 2016-03-02 DIAGNOSIS — M722 Plantar fascial fibromatosis: Secondary | ICD-10-CM | POA: Diagnosis not present

## 2016-03-02 NOTE — Progress Notes (Signed)
He presents today for pain in his right heel as a follow-up from plantar fasciitis and EPAT. He states that his foot hurts just as bad as it did prior to the procedures. He is disappointed that they did not work.  Objective: Vital signs are stable he is alert and oriented 3. Pulses are palpable. His sample patient may continue tubercle of the right heel.  Assessment: Chronic intractable plantar fasciitis right.  Plan: He'll be on vacation in the [place] for week and he is to try to see if that will help make the foot better if it does that he is on request time off for proximally 3 weeks. During that time I recommended that he follow-up with an MRI and consider surgical intervention.

## 2016-03-23 ENCOUNTER — Encounter: Payer: Self-pay | Admitting: Podiatry

## 2016-03-23 ENCOUNTER — Ambulatory Visit (INDEPENDENT_AMBULATORY_CARE_PROVIDER_SITE_OTHER): Payer: 59 | Admitting: Podiatry

## 2016-03-23 DIAGNOSIS — M722 Plantar fascial fibromatosis: Secondary | ICD-10-CM | POA: Diagnosis not present

## 2016-03-23 NOTE — Progress Notes (Signed)
He presents today with a chief complaint of a 7 out of 10 pain to his right heel. He is very upset that he has had no relief with the money that he has spent for his plantar fasciitis. He would like to consider an MRI for evaluation of the heel for surgical intervention.  Objective: Vital signs are stable he is alert and oriented 3 pain on palpation medial tubercle right. Pulses remain palpable. No calf pain.  Assessment: Chronic intractable plantar fasciitis right.  Plan: MRI will be performed in the near future for surgical consideration. He has failed all conservative therapies including E Pat injection therapy corticosteroid therapy nonsteroidals and physical therapy.

## 2016-03-24 ENCOUNTER — Telehealth: Payer: Self-pay | Admitting: *Deleted

## 2016-03-24 NOTE — Telephone Encounter (Addendum)
-----   Message from [place] sent at 03/23/2016  4:57 PM EDT ----- Regarding: MRI Orders are in! Patient wants to know cost first before he's scheduled. Thanks! MRI of right foot. 04/10/2016-[facility] sent statement informing our office,"pt does not want to do MRI at this time."

## 2016-03-27 ENCOUNTER — Telehealth: Payer: Self-pay

## 2016-03-27 NOTE — Telephone Encounter (Signed)
[place] line, no PA required for MRI

## 2016-07-03 ENCOUNTER — Ambulatory Visit (INDEPENDENT_AMBULATORY_CARE_PROVIDER_SITE_OTHER): Payer: 59 | Admitting: Cardiology

## 2016-07-03 ENCOUNTER — Encounter: Payer: Self-pay | Admitting: Cardiology

## 2016-07-03 ENCOUNTER — Encounter (INDEPENDENT_AMBULATORY_CARE_PROVIDER_SITE_OTHER): Payer: Self-pay

## 2016-07-03 VITALS — BP 160/94 | HR 74 | Ht 67.0 in | Wt 188.0 lb

## 2016-07-03 DIAGNOSIS — E7800 Pure hypercholesterolemia, unspecified: Secondary | ICD-10-CM | POA: Diagnosis not present

## 2016-07-03 DIAGNOSIS — Z8249 Family history of ischemic heart disease and other diseases of the circulatory system: Secondary | ICD-10-CM

## 2016-07-03 DIAGNOSIS — I350 Nonrheumatic aortic (valve) stenosis: Secondary | ICD-10-CM | POA: Diagnosis not present

## 2016-07-03 DIAGNOSIS — Z87891 Personal history of nicotine dependence: Secondary | ICD-10-CM | POA: Diagnosis not present

## 2016-07-03 NOTE — Progress Notes (Signed)
[place]. [address], [address] [place], [place]  [address] Phone: [phone] Fax:  [phone]  Date:  07/03/2016   ID:  ZYMIRE MIKES, DOB [birth year], MRN [id number]  PCP:  Donnie Coffin, MD   History of Present Illness: Cody Wilson is a 55 y.o. male with hyperlipidemia, strong family history of coronary artery disease, quit tob, mild aortic stenosis (2013 mean 35mmHg). Here for prevention/possible coronary artery disease follow up. Brother and sister with CAD. He is 3 first degree family members with heart disease. EKG recently perform shows sinus bradycardia otherwise normal. He is on his cholesterol medications as below. He denies any current chest pain, shortness of breath, syncope. EKG personally viewed.  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 On Sunday usually cuts the grass and does not have any difficulty with this. Plantar fasciitis has been an issue.   In 2009 he had a carotid Doppler done which showed mild plaque bilaterally. I once again appreciated bruit left greater than right, this is likely radiation murmur from aortic valve.   Quit smoking 2014. Doing great.     Wt Readings from Last 3 Encounters:  07/03/16 188 lb (85.3 kg)  06/09/15 185 lb 1.9 oz (84 kg)  06/08/14 189 lb (85.7 kg)     No past medical history on file.  Past Surgical History:  Procedure Laterality Date  . SHOULDER SURGERY      Current Outpatient Prescriptions  Medication Sig Dispense Refill  . esomeprazole (NEXIUM) 40 MG capsule Take 40 mg by mouth daily at 12 noon.     . ezetimibe-simvastatin (VYTORIN) 10-40 MG per tablet Take 1 tablet by mouth daily at 6 PM.     . Omega-3 Fatty Acids (FISH OIL) 1000 MG CAPS Take 1 capsule by mouth daily.      No current facility-administered medications for this visit.     Allergies:    Allergies  Allergen Reactions  . Other Nausea Only    ivp dye    Social History:  The  patient  reports that he has quit smoking. He does not have any smokeless tobacco history on file. He reports that he does not drink alcohol or use drugs.   Family History  Problem Relation Age of Onset  . Heart Problems Father   . Hypertension Mother    brother and sister with early CAD.  ROS:  Please see the history of present illness.   No fevers, no chills, no syncope, no bleeding, no orthopnea   All other systems reviewed and negative.   PHYSICAL EXAM: VS:  BP (!) 160/94   Pulse 74   Ht 5\' 7"  (1.702 m)   Wt 188 lb (85.3 kg)   BMI 29.44 kg/m  Well nourished, well developed, in no acute distress  HEENT: normal, [place]/AT, EOMI Neck: no JVD, normal carotid upstroke, radiation of murmur. Cardiac:  normal S1, S2; RRR; 2/6 systolic right upper sternal border murmur  Lungs:  clear to auscultation bilaterally, no wheezing, rhonchi or rales  Abd: soft, nontender, no hepatomegaly, no bruits  Ext: no edema, 2+ distal pulses Skin: warm and dry  GU: deferred Neuro: no focal abnormalities noted, AAO x 3  EKG: EKG was ordered today-07/03/16-sinus rhythm 74 with no other significant abnormalities personally viewed-prior 06/08/14 - Sinus rhythm, 64, no other abnormalities     ASSESSMENT AND PLAN:  1. Cardiac prevention-doing  well, he has stopped smoking. 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 [employer]. Trying to lose weight. He more fruits and vegetables. 2. Family history of CAD-as above 3. Elevated blood pressure-last 2 visits in the morning his blood pressure has been elevated. He states this is sometimes the case at home however after checking it in the evening time it is normal. He wonders if this is from the caffeine/coffee that he is drinking. 4. Mild aortic stenosis-previous echocardiogram in 2013-we will recheck this year. Murmur appreciated. Asymptomatic. 5. Hyperlipidemia-LDL 90 on 02/2014. Good control on Vytorin. 6. One-year followup  Signed, Candee Furbish, MD [facility]  07/03/2016  9:03 AM

## 2016-07-03 NOTE — Patient Instructions (Signed)
Medication Instructions:  Your physician recommends that you continue on your current medications as directed. Please refer to the Current Medication list given to you today.   Labwork: none  Testing/Procedures: Your physician has requested that you have an echocardiogram. Echocardiography is a painless test that uses sound waves to create images of your heart. It provides your doctor with information about the size and shape of your heart and how well your heart's chambers and valves are working. This procedure takes approximately one hour. There are no restrictions for this procedure.    Follow-Up: Your physician wants you to follow-up in: 1 year with Dr Marlou Porch. (November 2018).  You will receive a reminder letter in the mail two months in advance. If you don't receive a letter, please call our office to schedule the follow-up appointment.        If you need a refill on your cardiac medications before your next appointment, please call your pharmacy.

## 2016-08-09 ENCOUNTER — Ambulatory Visit (HOSPITAL_COMMUNITY): Payer: 59 | Attending: Cardiology

## 2016-08-09 ENCOUNTER — Other Ambulatory Visit: Payer: Self-pay

## 2016-08-09 DIAGNOSIS — Z8249 Family history of ischemic heart disease and other diseases of the circulatory system: Secondary | ICD-10-CM | POA: Diagnosis not present

## 2016-08-09 DIAGNOSIS — Z87891 Personal history of nicotine dependence: Secondary | ICD-10-CM | POA: Diagnosis not present

## 2016-08-09 DIAGNOSIS — I351 Nonrheumatic aortic (valve) insufficiency: Secondary | ICD-10-CM | POA: Insufficient documentation

## 2016-08-09 DIAGNOSIS — E7800 Pure hypercholesterolemia, unspecified: Secondary | ICD-10-CM | POA: Insufficient documentation

## 2016-08-09 DIAGNOSIS — I350 Nonrheumatic aortic (valve) stenosis: Secondary | ICD-10-CM | POA: Diagnosis not present

## 2016-08-18 ENCOUNTER — Encounter: Payer: Self-pay | Admitting: Cardiology

## 2017-04-06 DIAGNOSIS — K5792 Diverticulitis of intestine, part unspecified, without perforation or abscess without bleeding: Secondary | ICD-10-CM | POA: Diagnosis not present

## 2017-04-06 DIAGNOSIS — K635 Polyp of colon: Secondary | ICD-10-CM | POA: Diagnosis not present

## 2017-04-17 DIAGNOSIS — R109 Unspecified abdominal pain: Secondary | ICD-10-CM | POA: Diagnosis not present

## 2017-05-10 DIAGNOSIS — H34831 Tributary (branch) retinal vein occlusion, right eye, with macular edema: Secondary | ICD-10-CM | POA: Diagnosis not present

## 2017-05-10 DIAGNOSIS — H43391 Other vitreous opacities, right eye: Secondary | ICD-10-CM | POA: Diagnosis not present

## 2017-06-22 DIAGNOSIS — K648 Other hemorrhoids: Secondary | ICD-10-CM | POA: Diagnosis not present

## 2017-06-22 DIAGNOSIS — Z86010 Personal history of colonic polyps: Secondary | ICD-10-CM | POA: Diagnosis not present

## 2017-06-22 DIAGNOSIS — D126 Benign neoplasm of colon, unspecified: Secondary | ICD-10-CM | POA: Diagnosis not present

## 2017-06-26 DIAGNOSIS — H34831 Tributary (branch) retinal vein occlusion, right eye, with macular edema: Secondary | ICD-10-CM | POA: Diagnosis not present

## 2017-07-23 DIAGNOSIS — H34831 Tributary (branch) retinal vein occlusion, right eye, with macular edema: Secondary | ICD-10-CM | POA: Diagnosis not present

## 2017-08-08 ENCOUNTER — Encounter: Payer: Self-pay | Admitting: Cardiology

## 2017-08-10 ENCOUNTER — Encounter (INDEPENDENT_AMBULATORY_CARE_PROVIDER_SITE_OTHER): Payer: Self-pay

## 2017-08-10 ENCOUNTER — Encounter: Payer: Self-pay | Admitting: Cardiology

## 2017-08-10 ENCOUNTER — Ambulatory Visit: Payer: 59 | Admitting: Cardiology

## 2017-08-10 VITALS — BP 130/80 | HR 58 | Ht 67.0 in | Wt 192.0 lb

## 2017-08-10 DIAGNOSIS — I350 Nonrheumatic aortic (valve) stenosis: Secondary | ICD-10-CM | POA: Diagnosis not present

## 2017-08-10 DIAGNOSIS — Z87891 Personal history of nicotine dependence: Secondary | ICD-10-CM

## 2017-08-10 DIAGNOSIS — E7800 Pure hypercholesterolemia, unspecified: Secondary | ICD-10-CM

## 2017-08-10 DIAGNOSIS — Z8249 Family history of ischemic heart disease and other diseases of the circulatory system: Secondary | ICD-10-CM

## 2017-08-10 NOTE — Progress Notes (Signed)
[place]. [address], [address] [place], [place]  [address] Phone: [phone] Fax:  [phone]  Date:  08/10/2017   ID:  Cody Wilson, DOB Oct 09, 1960, MRN [id number]  PCP:  Aurea Graff.Marlou Sa, MD   History of Present Illness: Cody Wilson is a 56 y.o. male with hyperlipidemia, strong family history of coronary artery disease, quit tob, mild aortic stenosis (2013 mean 4mmHg). Here for prevention/possible coronary artery disease follow up. Brother and sister with CAD. He is 3 first degree family members with heart disease. EKG recently perform shows sinus bradycardia otherwise normal. He is on his cholesterol medications as below. He denies any current chest pain, shortness of breath, syncope. EKG personally viewed.  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 On Sunday usually cuts the grass and does not have any difficulty with this. Plantar fasciitis has been an issue.   In 2009 he had a carotid Doppler done which showed mild plaque bilaterally. I once again appreciated bruit left greater than right, this is likely radiation murmur from aortic valve.   Quit smoking 2014. Doing great.   08/10/17: Overall been feeling well.  No chest pain shortness of breath fevers chills nausea vomiting.  Sometimes he thinks he can hear his murmur at night when he lays in bed.  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    Wt Readings from Last 3 Encounters:  08/10/17 192 lb (87.1 kg)  07/03/16 188 lb (85.3 kg)  06/09/15 185 lb 1.9 oz (84 kg)     History reviewed. No pertinent past medical history.  Past Surgical History:  Procedure Laterality Date  . SHOULDER SURGERY      Current Outpatient Medications  Medication Sig Dispense Refill  . esomeprazole (NEXIUM) 40 MG capsule Take 40 mg by mouth daily at 12 noon.     . ezetimibe-simvastatin (VYTORIN) 10-40 MG per tablet Take 1 tablet by mouth  daily at 6 PM.     . Omega-3 Fatty Acids (FISH OIL) 1000 MG CAPS Take 1 capsule by mouth daily.      No current facility-administered medications for this visit.     Allergies:    Allergies  Allergen Reactions  . Other Nausea Only    ivp dye    Social History:  The patient  reports that he has quit smoking. he has never used smokeless tobacco. He reports that he does not drink alcohol or use drugs.   Family History  Problem Relation Age of Onset  . Heart Problems Father   . Hypertension Mother    brother and sister with early CAD.  ROS:  Please see the history of present illness.     All others negative  PHYSICAL EXAM: VS:  BP 130/80   Pulse (!) 58   Ht 5\' 7"  (1.702 m)   Wt 192 lb (87.1 kg)   SpO2 99%   BMI 30.07 kg/m  GEN: Well nourished, well developed, in no acute distress  HEENT: normal  Neck: no JVD, carotid bruits, or masses Cardiac: RRR; 2/6 S murmur, no rubs, or gallops,no edema  Respiratory:  clear to auscultation bilaterally, normal work of breathing GI: soft, nontender, nondistended, + BS MS: no deformity or atrophy  Skin: warm and dry, no rash Neuro:  Alert and Oriented x 3, Strength and sensation are intact Psych: euthymic mood, full  affect   EKG: EKG was ordered today- 08/10/17 sinus bradycardia rate 58 with no other abnormalities 07/03/16-sinus rhythm 74 with no other significant abnormalities personally viewed-prior 06/08/14 - Sinus rhythm, 64, no other abnormalities     Labs: Total cholesterol 155, LDL 87, triglycerides 54, creatinine 0.9.  ASSESSMENT AND PLAN:  1. Cardiac prevention-doing well, he has stopped smoking. 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 [employer]. Trying to lose weight. He more fruits and vegetables. 2. Family history of CAD-as above 3. Elevated blood pressure- well controlled.   4. Mild aortic stenosis-stable, echocardiogram 2017 reviewed, no need to repeat this year, murmur appreciated. Asymptomatic.  Doing well.   Described to him once again. 5. Hyperlipidemia-LDL 90 on 02/2014. Good control on Vytorin.  No changes, no myalgias 6. One-year followup  Signed, Candee Furbish, MD [facility]  08/10/2017 8:51 AM

## 2017-08-10 NOTE — Patient Instructions (Signed)
Medication Instructions:  Your physician recommends that you continue on your current medications as directed. Please refer to the Current Medication list given to you today.  Labwork: NONE ORDERED TODAY  Testing/Procedures: NONE ORDERED   Follow-Up: Your physician wants you to follow-up in: [place] will receive a reminder letter in the mail two months in advance. If you don't receive a letter, please call our office to schedule the follow-up appointment.   Any Other Special Instructions Will Be Listed Below (If Applicable).     If you need a refill on your cardiac medications before your next appointment, please call your pharmacy.

## 2017-08-23 DIAGNOSIS — H34831 Tributary (branch) retinal vein occlusion, right eye, with macular edema: Secondary | ICD-10-CM | POA: Diagnosis not present

## 2017-08-29 DIAGNOSIS — H34831 Tributary (branch) retinal vein occlusion, right eye, with macular edema: Secondary | ICD-10-CM | POA: Diagnosis not present

## 2017-09-11 DIAGNOSIS — Z1159 Encounter for screening for other viral diseases: Secondary | ICD-10-CM | POA: Diagnosis not present

## 2017-09-11 DIAGNOSIS — H3561 Retinal hemorrhage, right eye: Secondary | ICD-10-CM | POA: Diagnosis not present

## 2017-09-11 DIAGNOSIS — Z0000 Encounter for general adult medical examination without abnormal findings: Secondary | ICD-10-CM | POA: Diagnosis not present

## 2017-09-11 DIAGNOSIS — Z125 Encounter for screening for malignant neoplasm of prostate: Secondary | ICD-10-CM | POA: Diagnosis not present

## 2017-09-11 DIAGNOSIS — E7800 Pure hypercholesterolemia, unspecified: Secondary | ICD-10-CM | POA: Diagnosis not present

## 2017-09-27 DIAGNOSIS — K529 Noninfective gastroenteritis and colitis, unspecified: Secondary | ICD-10-CM | POA: Diagnosis not present

## 2017-09-27 DIAGNOSIS — R6889 Other general symptoms and signs: Secondary | ICD-10-CM | POA: Diagnosis not present

## 2017-11-22 DIAGNOSIS — H34831 Tributary (branch) retinal vein occlusion, right eye, with macular edema: Secondary | ICD-10-CM | POA: Diagnosis not present

## 2018-02-05 DIAGNOSIS — H34831 Tributary (branch) retinal vein occlusion, right eye, with macular edema: Secondary | ICD-10-CM | POA: Diagnosis not present

## 2018-03-06 DIAGNOSIS — R350 Frequency of micturition: Secondary | ICD-10-CM | POA: Diagnosis not present

## 2018-04-03 DIAGNOSIS — H34831 Tributary (branch) retinal vein occlusion, right eye, with macular edema: Secondary | ICD-10-CM | POA: Diagnosis not present

## 2018-04-10 DIAGNOSIS — H6122 Impacted cerumen, left ear: Secondary | ICD-10-CM | POA: Diagnosis not present

## 2018-04-10 DIAGNOSIS — M549 Dorsalgia, unspecified: Secondary | ICD-10-CM | POA: Diagnosis not present

## 2018-05-08 DIAGNOSIS — H34831 Tributary (branch) retinal vein occlusion, right eye, with macular edema: Secondary | ICD-10-CM | POA: Diagnosis not present

## 2018-07-03 DIAGNOSIS — H34831 Tributary (branch) retinal vein occlusion, right eye, with macular edema: Secondary | ICD-10-CM | POA: Diagnosis not present

## 2018-07-19 DIAGNOSIS — B349 Viral infection, unspecified: Secondary | ICD-10-CM | POA: Diagnosis not present

## 2018-08-13 ENCOUNTER — Encounter: Payer: Self-pay | Admitting: Cardiology

## 2018-08-13 ENCOUNTER — Ambulatory Visit: Payer: 59 | Admitting: Cardiology

## 2018-08-13 VITALS — BP 122/84 | HR 58 | Ht 67.0 in | Wt 186.1 lb

## 2018-08-13 DIAGNOSIS — Z8249 Family history of ischemic heart disease and other diseases of the circulatory system: Secondary | ICD-10-CM | POA: Diagnosis not present

## 2018-08-13 DIAGNOSIS — E7800 Pure hypercholesterolemia, unspecified: Secondary | ICD-10-CM | POA: Diagnosis not present

## 2018-08-13 DIAGNOSIS — I350 Nonrheumatic aortic (valve) stenosis: Secondary | ICD-10-CM

## 2018-08-13 NOTE — Progress Notes (Signed)
Cardiology Office Note:    Date:  08/13/2018   ID:  Cody Wilson, DOB 06-Apr-1961, MRN [id number]  PCP:  Cody Wilson.Cody Sa, MD  Cardiologist:  No primary care provider on file.  Electrophysiologist:  None   Referring MD: Cody Wilson, L.Cody Sa, MD     History of Present Illness:    Cody Wilson is a 57 y.o. male here for follow-up of aortic stenosis.  In 2013 his mean gradient was 15 mmHg.  Also has hyperlipidemia and strong family history of CAD.  Both his brother and sister have coronary artery disease.  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  Quit smoking in 2014.  [employer].  Knee pain, muscle pain, back pain sometimes from working on cars.  Had some issues with right eye vision.  Retinal specialist noticed that there was a bleed in the retina.  He has been getting monthly eye injections to help with this.  Overall he is not having any chest pain, no fevers chills nausea vomiting syncope.  History reviewed. No pertinent past medical history.  Past Surgical History:  Procedure Laterality Date  . SHOULDER SURGERY      Current Medications: Current Meds  Medication Sig  . esomeprazole (NEXIUM) 40 MG capsule Take 40 mg by mouth daily at 12 noon.   . ezetimibe-simvastatin (VYTORIN) 10-40 MG per tablet Take 1 tablet by mouth daily at 6 PM.   . Omega-3 Fatty Acids (FISH OIL) 1000 MG CAPS Take 1 capsule by mouth daily.      Allergies:   Other   Social History   Socioeconomic History  . Marital status: Married    Spouse name: Not on file  . Number of children: Not on file  . Years of education: Not on file  . Highest education level: Not on file  Occupational History  . Not on file  Social Needs  . Financial resource strain: Not on file  . Food insecurity:    Worry: Not on file    Inability: Not on file  . Transportation needs:    Medical: Not on file    Non-medical: Not on file  Tobacco Use  . Smoking status: Former Research scientist (life sciences)  . Smokeless tobacco: Never Used    Substance and Sexual Activity  . Alcohol use: No    Alcohol/week: 0.0 standard drinks  . Drug use: No  . Sexual activity: Yes  Lifestyle  . Physical activity:    Days per week: Not on file    Minutes per session: Not on file  . Stress: Not on file  Relationships  . Social connections:    Talks on phone: Not on file    Gets together: Not on file    Attends religious service: Not on file    Active member of club or organization: Not on file    Attends meetings of clubs or organizations: Not on file    Relationship status: Not on file  Other Topics Concern  . Not on file  Social History Narrative  . Not on file     Family History: The patient's family history includes Heart Problems in his father; Hypertension in his mother.  ROS:   Please see the history of present illness.    Denies any fevers chills nausea vomiting syncope bleeding for back pain muscle pain all other systems reviewed and are negative.  EKGs/Labs/Other Studies Reviewed:    The following studies were reviewed today: Prior office notes EKG lab work echo  Echocardiogram 08/09/2016: - Left ventricle: The cavity size was normal. Wall thickness was   normal. Systolic function was normal. The estimated ejection   fraction was in the range of 60% to 65%. Wall motion was normal;   there were no regional wall motion abnormalities. Left   ventricular diastolic function parameters were normal. - Aortic valve: Mildly calcified leaflets. The right and left   coronary cusps are at least partially fused. There was mild   stenosis. There was trivial regurgitation. Mean gradient (S): 14   mm Hg. Valve area (VTI): 1.55 cm^2. - Mitral valve: There was no significant regurgitation. - Left atrium: The atrium was mildly dilated. - Right ventricle: The cavity size was normal. Systolic function   was normal. - Tricuspid valve: Peak RV-RA gradient (S): 22 mm Hg. - Pulmonary arteries: PA peak pressure: 25 mm Hg (S). -  Inferior vena cava: The vessel was normal in size. The   respirophasic diameter changes were in the normal range (>= 50%),   consistent with normal central venous pressure.  Impressions:  - Normal LV size with EF 60-65%. Normal diastolic function. Normal   RV size and systolic function. Mild aortic stenosis, at least   partial fusion of the right and left coronary cusps.  EKG:  EKG is  ordered today.  The ekg ordered today demonstrates sinus bradycardia 58 no other abnormalities.  Personally reviewed and interpreted  Recent Labs: No results found for requested labs within last 8760 hours.  Recent Lipid Panel No results found for: CHOL, TRIG, HDL, CHOLHDL, VLDL, LDLCALC, LDLDIRECT  Physical Exam:    VS:  BP 122/84   Pulse (!) 58   Ht 5\' 7"  (1.702 m)   Wt 186 lb 1.9 oz (84.4 kg)   BMI 29.15 kg/m     Wt Readings from Last 3 Encounters:  08/13/18 186 lb 1.9 oz (84.4 kg)  08/10/17 192 lb (87.1 kg)  07/03/16 188 lb (85.3 kg)     GEN:  Well nourished, well developed in no acute distress HEENT: Normal NECK: No JVD; No carotid bruits LYMPHATICS: No lymphadenopathy CARDIAC: RRR, 2/6 systolic murmur right upper sternal border, no rubs, gallops RESPIRATORY:  Clear to auscultation without rales, wheezing or rhonchi  ABDOMEN: Soft, non-tender, non-distended MUSCULOSKELETAL:  No edema; No deformity  SKIN: Warm and dry NEUROLOGIC:  Alert and oriented x 3 PSYCHIATRIC:  Normal affect   ASSESSMENT:    1. Aortic stenosis, mild   2. Pure hypercholesterolemia   3. Family history of coronary arteriosclerosis    PLAN:    In order of problems listed above:  Mild aortic stenosis - Echocardiogram 2017 with mild stable disease.  Echo reviewed.  Doing well.  We will forego echocardiogram this year.  Continue to monitor.  Murmur is unchanged, still mild.  Continue with good blood pressure control, diet, exercise.  He is using his stationary bicycle trainer at times.  Hyperlipidemia -  LDL 88 on 09/11/2017.  Creatinine 0.8.  Excellent.  Family history of CAD -Continue with prevention efforts.  Currently on Vytorin, fish oil.   Medication Adjustments/Labs and Tests Ordered: Current medicines are reviewed at length with the patient today.  Concerns regarding medicines are outlined above.  Orders Placed This Encounter  Procedures  . EKG 12-Lead   No orders of the defined types were placed in this encounter.   Patient Instructions  Medication Instructions:  Your provider recommends that you continue on your current medications as directed. Please refer to the Current  Medication list given to you today.    Labwork: None  Testing/Procedures: None  Follow-Up: Your provider wants you to follow-up in: 1 year with Dr. Marlou Porch. You will receive a reminder letter in the mail two months in advance. If you don't receive a letter, please call our office to schedule the follow-up appointment.        Signed, Candee Furbish, MD  08/13/2018 9:13 AM    [facility]

## 2018-08-13 NOTE — Patient Instructions (Signed)
Medication Instructions:  Your provider recommends that you continue on your current medications as directed. Please refer to the Current Medication list given to you today.    Labwork: None  Testing/Procedures: None  Follow-Up: Your provider wants you to follow-up in: 1 year with Dr. Skains. You will receive a reminder letter in the mail two months in advance. If you don't receive a letter, please call our office to schedule the follow-up appointment.      

## 2018-09-18 DIAGNOSIS — H34831 Tributary (branch) retinal vein occlusion, right eye, with macular edema: Secondary | ICD-10-CM | POA: Diagnosis not present

## 2018-09-24 DIAGNOSIS — E7800 Pure hypercholesterolemia, unspecified: Secondary | ICD-10-CM | POA: Diagnosis not present

## 2018-09-24 DIAGNOSIS — Z0000 Encounter for general adult medical examination without abnormal findings: Secondary | ICD-10-CM | POA: Diagnosis not present

## 2018-09-24 DIAGNOSIS — Z125 Encounter for screening for malignant neoplasm of prostate: Secondary | ICD-10-CM | POA: Diagnosis not present

## 2018-10-23 DIAGNOSIS — H34831 Tributary (branch) retinal vein occlusion, right eye, with macular edema: Secondary | ICD-10-CM | POA: Diagnosis not present

## 2018-11-04 DIAGNOSIS — M542 Cervicalgia: Secondary | ICD-10-CM | POA: Diagnosis not present

## 2018-11-04 DIAGNOSIS — J309 Allergic rhinitis, unspecified: Secondary | ICD-10-CM | POA: Diagnosis not present

## 2018-11-22 DIAGNOSIS — H34831 Tributary (branch) retinal vein occlusion, right eye, with macular edema: Secondary | ICD-10-CM | POA: Diagnosis not present

## 2018-12-12 DIAGNOSIS — M542 Cervicalgia: Secondary | ICD-10-CM | POA: Diagnosis not present

## 2019-06-11 ENCOUNTER — Emergency Department (INDEPENDENT_AMBULATORY_CARE_PROVIDER_SITE_OTHER): Payer: 59

## 2019-06-11 ENCOUNTER — Emergency Department (INDEPENDENT_AMBULATORY_CARE_PROVIDER_SITE_OTHER)
Admission: EM | Admit: 2019-06-11 | Discharge: 2019-06-11 | Disposition: A | Discharge status: Home / Self Care | Payer: Worker's Compensation | Source: Home / Self Care | Attending: Emergency Medicine | Admitting: Emergency Medicine

## 2019-06-11 ENCOUNTER — Other Ambulatory Visit: Payer: Self-pay

## 2019-06-11 ENCOUNTER — Encounter: Payer: Self-pay | Admitting: Emergency Medicine

## 2019-06-11 DIAGNOSIS — M25462 Effusion, left knee: Secondary | ICD-10-CM

## 2019-06-11 DIAGNOSIS — S8002XA Contusion of left knee, initial encounter: Secondary | ICD-10-CM | POA: Diagnosis not present

## 2019-06-11 MED ORDER — MELOXICAM 7.5 MG PO TABS: ORAL_TABLET | ORAL | 0 refills | Status: DC

## 2019-06-11 NOTE — ED Provider Notes (Addendum)
[facility]    CSN: [id number] Arrival date & time: 06/11/19  1254      History   Chief Complaint Chief Complaint  Patient presents with  . Knee Injury    HPI Cody Wilson is a 58 y.o. male.   HPI  History reviewed. No pertinent past medical history. Worker's Comp. injury.  Employer, Secondary school teacher.  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 The pain persists just below the left kneecap, is very sharp, hurts to press on and worse with standing or trying to kneel or squat.  No radiation.  Has not tried any particular treatment.  Pain intensity can be as high as 8 out of 10.  Associated symptoms: Denies focal numbness or weakness.  Denies hip pain or left ankle or foot pain.  No chest pain or shortness of breath or cough or fever or chills or nausea or vomiting. Patient Active Problem List   Diagnosis Date Noted  . Hyperlipidemia 06/08/2014  . Family history of coronary arteriosclerosis 06/08/2014  . Former smoker 06/08/2014    Past Surgical History:  Procedure Laterality Date  . SHOULDER SURGERY         Home Medications    Prior to Admission medications   Medication Sig Start Date End Date Taking? Authorizing Provider  esomeprazole (NEXIUM) 40 MG capsule Take 40 mg by mouth daily at 12 noon.     [provider]  ezetimibe-simvastatin (VYTORIN) 10-40 MG per tablet Take 1 tablet by mouth daily at 6 PM.     [provider]  meloxicam (MOBIC) 7.5 MG tablet Take 1 twice a day as needed for pain. Take with food. (Do not take with any other NSAID.) 06/11/19   Jacqulyn Cane, MD  Omega-3 Fatty Acids (FISH OIL) 1000 MG CAPS Take 1 capsule by mouth daily.     [provider]    Family History Family History  Problem Relation Age of Onset  . Heart Problems Father   . Hypertension Mother      Social History Social History   Tobacco Use  . Smoking status: Former Smoker    Types: Cigarettes  . Smokeless tobacco: Never Used  Substance Use Topics  . Alcohol use: Yes    Alcohol/week: 0.0 standard drinks  . Drug use: No     Allergies   Other   Review of Systems Review of Systems  All other systems reviewed and are negative.  Pertinent items noted in HPI and remainder of comprehensive ROS otherwise negative.   Physical Exam Triage Vital Signs ED Triage Vitals  Enc Vitals Group     BP 06/11/19 1313 119/86     Pulse Rate 06/11/19 1313 66     Resp --      Temp 06/11/19 1313 98.4 F (36.9 C)     Temp Source 06/11/19 1313 Oral     SpO2 06/11/19 1313 98 %     Weight 06/11/19 1314 180 lb (81.6 kg)     Height 06/11/19 1314 5\' 7"  (1.702 m)     Head Circumference --      Peak Flow --      Pain Score 06/11/19 1314 6     Pain Loc --      Pain Edu? --      Excl. in [place]? --    No  data found.  Updated Vital Signs BP 119/86 (BP Location: Right Arm)   Pulse 66   Temp 98.4 F (36.9 C) (Oral)   Ht 5\' 7"  (1.702 m)   Wt 81.6 kg   SpO2 98%   BMI 28.19 kg/m   Visual Acuity Right Eye Distance:   Left Eye Distance:   Bilateral Distance:    Right Eye Near:   Left Eye Near:    Bilateral Near:     Physical Exam Vitals signs reviewed.  Constitutional:      Appearance: He is well-developed.     Comments: Appears uncomfortable from left knee pain.  He walks with a limp favoring left knee.  HENT:     Head: Normocephalic and atraumatic.  Eyes:     General: No scleral icterus.    Pupils: Pupils are equal, round, and reactive to light.  Neck:     Musculoskeletal: Normal range of motion and neck supple.  Cardiovascular:     Rate and Rhythm: Normal rate and regular rhythm.  Pulmonary:     Effort: Pulmonary effort is normal.  Abdominal:     General: There is no distension.  Musculoskeletal:     Left hip: Normal.     Left knee: He exhibits decreased range of  motion, swelling and bony tenderness. He exhibits no ecchymosis and no laceration.     Right lower leg: No edema.     Left lower leg: No edema.       Legs:     Comments: Exquisitely tender, mildly swollen anterior tibial tubercle. Neurovascular distally intact. Left ankle and left foot exam within normal limits.  Nontender.  No deformity.  Skin:    General: Skin is warm and dry.     Capillary Refill: Capillary refill takes less than 2 seconds.     Findings: No bruising, rash or wound.  Neurological:     Mental Status: He is alert and oriented to person, place, and time.     Cranial Nerves: No cranial nerve deficit.     Sensory: No sensory deficit.     Motor: No weakness.  Psychiatric:        Behavior: Behavior normal.      UC Treatments / Results  Labs (all labs ordered are listed, but only abnormal results are displayed) Labs Reviewed - No data to display  EKG   Radiology Dg Knee Complete 4 Views Left  Result Date: 06/11/2019 CLINICAL DATA:  Knee injury 06/06/2019 with pain and swelling EXAM: LEFT KNEE - COMPLETE 4+ VIEW COMPARISON:  None. FINDINGS: Trace suprapatellar left knee joint effusion. No fracture. No dislocation. No suspicious focal osseous lesion. No significant degenerative or erosive arthropathy. No radiopaque foreign body. IMPRESSION: Trace suprapatellar left knee joint effusion. No fracture or malalignment. Electronically Signed   By: Ilona Sorrel M.D.   On: 06/11/2019 13:49   EXAM: LEFT KNEE - COMPLETE 4+ VIEW  COMPARISON:  None.  FINDINGS: Trace suprapatellar left knee joint effusion. No fracture. No dislocation. No suspicious focal osseous lesion. No significant degenerative or erosive arthropathy. No radiopaque foreign body.  IMPRESSION: Trace suprapatellar left knee joint effusion. No fracture or malalignment.   Electronically Signed   By: Ilona Sorrel M.D.   On: 06/11/2019 13:49 Procedures Procedures (including critical care time)   Medications Ordered in UC Medications - No data to display  Initial Impression / Assessment and Plan / UC Course  I have reviewed the triage vital signs and the nursing notes.  Pertinent  labs & imaging results that were available during my care of the patient were reviewed by me and considered in my medical decision making (see chart for details).      Final Clinical Impressions(s) / UC Diagnoses   Final diagnoses:  Contusion of left knee, initial encounter     Discharge Instructions     X-ray left knee shows no fracture.  You have a severe bruise of left knee, date of injury 06/06/2019. Treatment: Ace bandage applied.  Left knee immobilizer applied.  May alternate heat and ice. I sent a prescription to your pharmacy for nondrowsy, nonnarcotic, anti-inflammatory pain medicine called meloxicam.  7.5 mg.  Take twice a day as needed for pain. Work status: Sitdown work only.  As no sitdown work available, may not work. His work status for 7 days.  You need to follow-up with orthopedist within 7 days to recheck and further reevaluate and determine treatment plan and work status.     Written instructions given with AVS.  Verbal instructions given at length.  Questions invited and answered.  He voiced understanding and agreement with treatment plan. Work note written, specifying may not work for 7 days.  And that he needs to follow-up with orthopedist within 7 days.  ED Prescriptions    Medication Sig Dispense Auth. Provider   meloxicam (MOBIC) 7.5 MG tablet Take 1 twice a day as needed for pain. Take with food. (Do not take with any other NSAID.) 20 tablet Jacqulyn Cane, MD     PDMP not reviewed this encounter.   Jacqulyn Cane, MD 06/11/19 1432    Jacqulyn Cane, MD 06/11/19 [phone]

## 2019-06-11 NOTE — Discharge Instructions (Addendum)
X-ray left knee shows no fracture.  You have a severe bruise of left knee, date of injury 06/06/2019. Treatment: Ace bandage applied.  Left knee immobilizer applied.  May alternate heat and ice. I sent a prescription to your pharmacy for nondrowsy, nonnarcotic, anti-inflammatory pain medicine called meloxicam.  7.5 mg.  Take twice a day as needed for pain. Work status: Sitdown work only.  As no sitdown work available, may not work. His work status for 7 days.  You need to follow-up with orthopedist within 7 days to recheck and further reevaluate and determine treatment plan and work status.

## 2019-06-11 NOTE — ED Triage Notes (Signed)
Left knee injury at work on Friday, ding alignment on car hit knee on alignment machine.

## 2019-10-16 ENCOUNTER — Ambulatory Visit: Payer: 59 | Admitting: Cardiology

## 2019-11-12 ENCOUNTER — Ambulatory Visit: Payer: 59 | Admitting: Cardiology

## 2019-11-12 ENCOUNTER — Encounter: Payer: Self-pay | Admitting: Cardiology

## 2019-11-12 ENCOUNTER — Other Ambulatory Visit: Payer: Self-pay

## 2019-11-12 VITALS — BP 152/90 | HR 59 | Ht 67.0 in | Wt 194.0 lb

## 2019-11-12 DIAGNOSIS — I350 Nonrheumatic aortic (valve) stenosis: Secondary | ICD-10-CM

## 2019-11-12 DIAGNOSIS — E7800 Pure hypercholesterolemia, unspecified: Secondary | ICD-10-CM

## 2019-11-12 DIAGNOSIS — Z8249 Family history of ischemic heart disease and other diseases of the circulatory system: Secondary | ICD-10-CM | POA: Diagnosis not present

## 2019-11-12 NOTE — Progress Notes (Signed)
Cardiology Office Note:    Date:  11/12/2019   ID:  Cody Wilson, DOB 06/07/1961, MRN [id number]  PCP:  Aurea Graff.Marlou Sa, MD  Cardiologist:  Candee Furbish, MD  Electrophysiologist:  None   Referring MD: Aurea Graff.Marlou Sa, MD     History of Present Illness:    Cody Wilson is a 59 y.o. male here for the follow-up of aortic stenosis.  2013-mean gradient 15 mmHg 2017-mean gradient 14 mmHg  Overall been doing well without any fevers chills nausea vomiting syncope bleeding.  Murmur noted.  Uses stationary bicycle trainer.  Brother and sister have CAD.  He is a Dealer.  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  Quit smoking in 2014.  Works for [employer].  Had a prior bleed in his retina.  Was previously getting monthly injections.  C3-4 pinched nerve. Ortho checks. "I don't do medication". No relief from injection.  Even tried acupuncture.  He states that he has not been to physical therapy.  Asked him to discuss this with Dr. Alroy Dust.    No past medical history on file.  Past Surgical History:  Procedure Laterality Date  . SHOULDER SURGERY      Current Medications: Current Meds  Medication Sig  . esomeprazole (NEXIUM) 40 MG capsule Take 40 mg by mouth daily at 12 noon.   . ezetimibe-simvastatin (VYTORIN) 10-40 MG per tablet Take 1 tablet by mouth daily at 6 PM.   . Omega-3 Fatty Acids (FISH OIL) 1000 MG CAPS Take 1 capsule by mouth daily.      Allergies:   Other   Social History   Socioeconomic History  . Marital status: Married    Spouse name: Not on file  . Number of children: Not on file  . Years of education: Not on file  . Highest education level: Not on file  Occupational History  . Not on file  Tobacco Use  . Smoking status: Former Smoker    Types: Cigarettes  . Smokeless tobacco: Never Used  Substance and Sexual Activity  . Alcohol use: Yes    Alcohol/week: 0.0 standard drinks  . Drug use: No  . Sexual activity: Yes  Other Topics Concern   . Not on file  Social History Narrative  . Not on file   Social Determinants of Health   Financial Resource Strain:   . Difficulty of Paying Living Expenses:   Food Insecurity:   . Worried About Charity fundraiser in the Last Year:   . Arboriculturist in the Last Year:   Transportation Needs:   . Film/video editor (Medical):   Marland Kitchen Lack of Transportation (Non-Medical):   Physical Activity:   . Days of Exercise per Week:   . Minutes of Exercise per Session:   Stress:   . Feeling of Stress :   Social Connections:   . Frequency of Communication with Friends and Family:   . Frequency of Social Gatherings with Friends and Family:   . Attends Religious Services:   . Active Member of Clubs or Organizations:   . Attends Archivist Meetings:   Marland Kitchen Marital Status:      Family History: The patient's family history includes Heart Problems in his father; Hypertension in his mother.  ROS:   Please see the history of present illness.     All other systems reviewed and are negative.  EKGs/Labs/Other Studies Reviewed:    The following studies were reviewed today: Echo is as above.  EKG:  EKG is  ordered today.  The ekg ordered today demonstrates sinus rhythm no other abnormalities  Recent Labs: No results found for requested labs within last 8760 hours.  Recent Lipid Panel No results found for: CHOL, TRIG, HDL, CHOLHDL, VLDL, LDLCALC, LDLDIRECT  Physical Exam:    VS:  BP (!) 152/90   Pulse (!) 59   Ht 5\' 7"  (1.702 m)   Wt 194 lb (88 kg)   BMI 30.38 kg/m     Wt Readings from Last 3 Encounters:  11/12/19 194 lb (88 kg)  06/11/19 180 lb (81.6 kg)  08/13/18 186 lb 1.9 oz (84.4 kg)     GEN:  Well nourished, well developed in no acute distress HEENT: Normal NECK: No JVD; No carotid bruits LYMPHATICS: No lymphadenopathy CARDIAC: Regular rate and rhythm with 2/6 systolic murmur right upper sternal border no rubs, gallops RESPIRATORY:  Clear to auscultation  without rales, wheezing or rhonchi  ABDOMEN: Soft, non-tender, non-distended MUSCULOSKELETAL:  No edema; No deformity  SKIN: Warm and dry NEUROLOGIC:  Alert and oriented x 3 PSYCHIATRIC:  Normal affect   ASSESSMENT:    1. Aortic stenosis, mild   2. Pure hypercholesterolemia   3. Family history of coronary arteriosclerosis    PLAN:    In order of problems listed above:  Aortic stenosis -Has been mild.  Last echo 2017.  I think it is a good idea for [place] to repeat echo since it has been about 4 years.  Continue with exercise.  Good blood pressure control.  Hyperlipidemia -LDL 71 ALT 19 creatinine 0.9 hemoglobin 16.3 from outside labs.  Potassium 4.  Excellent.  Family history of coronary artery disease -Continue with prevention strategies.  Statin, Zetia (Vytorin).   Medication Adjustments/Labs and Tests Ordered: Current medicines are reviewed at length with the patient today.  Concerns regarding medicines are outlined above.  Orders Placed This Encounter  Procedures  . EKG 12-Lead  . ECHOCARDIOGRAM COMPLETE   No orders of the defined types were placed in this encounter.   Patient Instructions  Medication Instructions:  Your physician recommends that you continue on your current medications as directed. Please refer to the Current Medication list given to you today.  *If you need a refill on your cardiac medications before your next appointment, please call your pharmacy*   Testing/Procedures: Your physician has requested that you have an echocardiogram. Echocardiography is a painless test that uses sound waves to create images of your heart. It provides your doctor with information about the size and shape of your heart and how well your heart's chambers and valves are working. This procedure takes approximately one hour. There are no restrictions for this procedure.  Follow-Up: At [facility], you and your health needs are our priority.  As part of our continuing  mission to provide you with exceptional heart care, we have created designated Provider Care Teams.  These Care Teams include your primary Cardiologist (physician) and Advanced Practice Providers (APPs -  Physician Assistants and Nurse Practitioners) who all work together to provide you with the care you need, when you need it.   Your next appointment:   1 year(s)  The format for your next appointment:   In Person  Provider:   Candee Furbish, MD     Signed, Candee Furbish, MD  11/12/2019 9:32 AM    [place]

## 2019-11-12 NOTE — Patient Instructions (Signed)
Medication Instructions:  Your physician recommends that you continue on your current medications as directed. Please refer to the Current Medication list given to you today.  *If you need a refill on your cardiac medications before your next appointment, please call your pharmacy*   Testing/Procedures: Your physician has requested that you have an echocardiogram. Echocardiography is a painless test that uses sound waves to create images of your heart. It provides your doctor with information about the size and shape of your heart and how well your heart's chambers and valves are working. This procedure takes approximately one hour. There are no restrictions for this procedure.  Follow-Up: At [facility], you and your health needs are our priority.  As part of our continuing mission to provide you with exceptional heart care, we have created designated Provider Care Teams.  These Care Teams include your primary Cardiologist (physician) and Advanced Practice Providers (APPs -  Physician Assistants and Nurse Practitioners) who all work together to provide you with the care you need, when you need it.   Your next appointment:   1 year(s)  The format for your next appointment:   In Person  Provider:   Candee Furbish, MD

## 2019-11-19 ENCOUNTER — Other Ambulatory Visit: Payer: Self-pay

## 2019-11-19 ENCOUNTER — Ambulatory Visit (HOSPITAL_COMMUNITY): Payer: 59 | Attending: Cardiology

## 2019-11-19 DIAGNOSIS — I350 Nonrheumatic aortic (valve) stenosis: Secondary | ICD-10-CM | POA: Insufficient documentation

## 2020-06-14 ENCOUNTER — Encounter: Payer: Self-pay | Admitting: Emergency Medicine

## 2020-06-14 ENCOUNTER — Emergency Department
Admission: EM | Admit: 2020-06-14 | Discharge: 2020-06-14 | Disposition: A | Discharge status: Home / Self Care | Payer: 59 | Source: Home / Self Care

## 2020-06-14 ENCOUNTER — Other Ambulatory Visit: Payer: Self-pay

## 2020-06-14 DIAGNOSIS — R350 Frequency of micturition: Secondary | ICD-10-CM

## 2020-06-14 DIAGNOSIS — R300 Dysuria: Secondary | ICD-10-CM

## 2020-06-14 DIAGNOSIS — R109 Unspecified abdominal pain: Secondary | ICD-10-CM

## 2020-06-14 DIAGNOSIS — R339 Retention of urine, unspecified: Secondary | ICD-10-CM

## 2020-06-14 DIAGNOSIS — R10A2 Flank pain, left side: Secondary | ICD-10-CM

## 2020-06-14 DIAGNOSIS — R3911 Hesitancy of micturition: Secondary | ICD-10-CM

## 2020-06-14 LAB — POCT CBC W AUTO DIFF (K'VILLE URGENT CARE)

## 2020-06-14 LAB — POCT URINALYSIS DIP (MANUAL ENTRY)
Bilirubin, UA: NEGATIVE
Blood, UA: NEGATIVE
Glucose, UA: NEGATIVE mg/dL
Ketones, POC UA: NEGATIVE mg/dL
Nitrite, UA: NEGATIVE
Protein Ur, POC: NEGATIVE mg/dL
Spec Grav, UA: 1.015 (ref 1.010–1.025)
Urobilinogen, UA: 0.2 E.U./dL
pH, UA: 6.5 (ref 5.0–8.0)

## 2020-06-14 LAB — BASIC METABOLIC PANEL
BUN: 12 mg/dL (ref 7–25)
CO2: 23 mmol/L (ref 20–32)
Calcium: 9.2 mg/dL (ref 8.6–10.3)
Chloride: 102 mmol/L (ref 98–110)
Creat: 0.84 mg/dL (ref 0.70–1.33)
Glucose, Bld: 90 mg/dL (ref 65–99)
Potassium: 3.7 mmol/L (ref 3.5–5.3)
Sodium: 136 mmol/L (ref 135–146)

## 2020-06-14 MED ORDER — CEFTRIAXONE SODIUM 1 G IJ SOLR
1.0000 g | Freq: Once | INTRAMUSCULAR | Status: AC
  Administered 2020-06-14: 1 g via INTRAMUSCULAR

## 2020-06-14 MED ORDER — SULFAMETHOXAZOLE-TRIMETHOPRIM 800-160 MG PO TABS: 1.0000 | ORAL_TABLET | Freq: Two times a day (BID) | ORAL | 0 refills | Status: AC

## 2020-06-14 MED ORDER — KETOROLAC TROMETHAMINE 60 MG/2ML IM SOLN
60.0000 mg | Freq: Once | INTRAMUSCULAR | Status: AC
  Administered 2020-06-14: 60 mg via INTRAMUSCULAR

## 2020-06-14 MED ORDER — PHENAZOPYRIDINE HCL 95 MG PO TABS: 95.0000 mg | ORAL_TABLET | Freq: Three times a day (TID) | ORAL | 0 refills | Status: DC | PRN

## 2020-06-14 NOTE — ED Triage Notes (Addendum)
Urinary retention  started yesterday morning. Constant urge but very little urine comes out, he has been drinking a lot of water. Complains of low abdominal pain, scrotum and low back pain.

## 2020-06-14 NOTE — Discharge Instructions (Signed)
°  Please take your medications as prescribed. Be sure to stay well hydrated. If you pain continues to worsen or you are unable to urinate and develop abdominal bloating, please go to the emergency department for further evaluation and treatment.    A referral to urology has been placed, you should receive a call later today.  Please call their office if you have not heard from them by 2PM today.

## 2020-06-14 NOTE — ED Notes (Signed)
STAT pick up# [id number] 12:09 pm

## 2020-06-14 NOTE — ED Provider Notes (Signed)
[facility]    CSN: [id number] Arrival date & time: 06/14/20  0930      History   Chief Complaint Chief Complaint  Patient presents with  . Urinary Retention    HPI GREGOR DERSHEM is a 59 y.o. male.   HPI  KHAYMAN KIRSCH is a 59 y.o. male presenting to UC with c/o sudden onset dysuria, urgency, frequency, and decreased urine output since yesterday.  Associated Left flank pain radiating into LLQ. Denies fever, chills, n/v/d. He has been increasing his fluid intake but feels he cannot fully empty his bladder. Pt reports concern for prostate issues last year but his PCP checked his PSA and states it was normal.  Denies hx of kidney stones. No medication taken PTA. He called his PCP but could not get in. He also called [facility] but would be a new patient can cannot be seen until next month.    History reviewed. No pertinent past medical history.  Patient Active Problem List   Diagnosis Date Noted  . Hyperlipidemia 06/08/2014  . Family history of coronary arteriosclerosis 06/08/2014  . Former smoker 06/08/2014    Past Surgical History:  Procedure Laterality Date  . SHOULDER SURGERY         Home Medications    Prior to Admission medications   Medication Sig Start Date End Date Taking? Authorizing Provider  esomeprazole (NEXIUM) 40 MG capsule Take 40 mg by mouth daily at 12 noon.     [provider]  ezetimibe-simvastatin (VYTORIN) 10-40 MG per tablet Take 1 tablet by mouth daily at 6 PM.     [provider]  Omega-3 Fatty Acids (FISH OIL) 1000 MG CAPS Take 1 capsule by mouth daily.     [provider]  phenazopyridine (PYRIDIUM) 95 MG tablet Take 1 tablet (95 mg total) by mouth 3 (three) times daily as needed for pain. 06/14/20   Noe Gens, PA-C  sulfamethoxazole-trimethoprim (BACTRIM DS) 800-160 MG tablet Take 1 tablet by mouth 2 (two) times daily for 7 days. 06/14/20 06/21/20  Noe Gens, PA-C    Family History  Family History  Problem Relation Age of Onset  . Heart Problems Father   . Hypertension Mother     Social History Social History   Tobacco Use  . Smoking status: Former Smoker    Types: Cigarettes  . Smokeless tobacco: Never Used  Vaping Use  . Vaping Use: Never used  Substance Use Topics  . Alcohol use: Yes    Alcohol/week: 0.0 standard drinks  . Drug use: No     Allergies   Other   Review of Systems Review of Systems  Constitutional: Positive for fever (fever in triage). Negative for appetite change and chills.  Gastrointestinal: Positive for abdominal pain. Negative for diarrhea, nausea and vomiting.  Genitourinary: Positive for decreased urine volume, dysuria, flank pain (left), frequency and urgency. Negative for hematuria.  Musculoskeletal: Positive for back pain.  Neurological: Negative for dizziness, light-headedness and headaches.     Physical Exam Triage Vital Signs ED Triage Vitals  Enc Vitals Group     BP 06/14/20 0951 (!) 169/98     Pulse Rate 06/14/20 0951 100     Resp --      Temp 06/14/20 0951 (!) 100.4 F (38 C)     Temp Source 06/14/20 0951 Oral     SpO2 06/14/20 0951 97 %     Weight 06/14/20 0953 185 lb (83.9 kg)  Height 06/14/20 0953 5\' 7"  (1.702 m)     Head Circumference --      Peak Flow --      Pain Score 06/14/20 0951 8     Pain Loc --      Pain Edu? --      Excl. in [place]? --    No data found.  Updated Vital Signs BP (!) 169/98 (BP Location: Right Arm)   Pulse 100   Temp (!) 100.4 F (38 C) (Oral)   Ht 5\' 7"  (1.702 m)   Wt 185 lb (83.9 kg)   SpO2 97%   BMI 28.98 kg/m   Visual Acuity Right Eye Distance:   Left Eye Distance:   Bilateral Distance:    Right Eye Near:   Left Eye Near:    Bilateral Near:     Physical Exam Vitals and nursing note reviewed.  Constitutional:      General: He is not in acute distress.    Appearance: Normal appearance. He is well-developed. He is not ill-appearing, toxic-appearing or  diaphoretic.  HENT:     Head: Normocephalic and atraumatic.     Nose: Nose normal.     Mouth/Throat:     Mouth: Mucous membranes are moist.  Cardiovascular:     Rate and Rhythm: Normal rate and regular rhythm.  Pulmonary:     Effort: Pulmonary effort is normal. No respiratory distress.     Breath sounds: Normal breath sounds. No stridor. No wheezing, rhonchi or rales.  Abdominal:     General: There is no distension.     Palpations: Abdomen is soft. There is no mass.     Tenderness: There is abdominal tenderness. There is left CVA tenderness. There is no right CVA tenderness, guarding or rebound.     Hernia: No hernia is present.  Musculoskeletal:        General: Normal range of motion.     Cervical back: Normal range of motion.  Skin:    General: Skin is warm and dry.  Neurological:     Mental Status: He is alert and oriented to person, place, and time.  Psychiatric:        Behavior: Behavior normal.      UC Treatments / Results  Labs (all labs ordered are listed, but only abnormal results are displayed) Labs Reviewed  POCT URINALYSIS DIP (MANUAL ENTRY) - Abnormal; Notable for the following components:      Result Value   Leukocytes, UA Small (1+) (*)    All other components within normal limits  URINE CULTURE  BASIC METABOLIC PANEL  POCT CBC W AUTO DIFF ([facility])    EKG   Radiology No results found.  Procedures Procedures (including critical care time)  Medications Ordered in UC Medications  ketorolac (TORADOL) injection 60 mg (60 mg Intramuscular Given 06/14/20 1131)  cefTRIAXone (ROCEPHIN) injection 1 g (1 g Intramuscular Given 06/14/20 1132)    Initial Impression / Assessment and Plan / UC Course  I have reviewed the triage vital signs and the nursing notes.  Pertinent labs & imaging results that were available during my care of the patient were reviewed by me and considered in my medical decision making (see chart for details).     UA:  trace leukocytes Urine culture sent DDx: UTI, prostatitis, renal stone Tx in UC: Toradol 60mg  IM and Rocephin 1g IM  Pt declined CT renal stone study in UC, concerned about insurance coverage [place] unavailable today to check bladder,  pt declined going to ED due to financial concerns. Although pt reports urinary retention, he has been able to urinate several times in UC.  Abdominal exam not concerning for acute abdomen  Discussed pt with [facility], will try to fit him into schedule tomorrow for close f/u due to no prior hx of similar symptoms and severity of discomfort today.  CBC: WBC 14.7 BMP: pending   Rx: bactrim, pyridium   Call 911 or have someone drive you to the hospital if symptoms significantly worsening.  Final Clinical Impressions(s) / UC Diagnoses   Final diagnoses:  Urinary retention  Dysuria  Urinary hesitancy  Urinary frequency  Left flank pain     Discharge Instructions      Please take your medications as prescribed. Be sure to stay well hydrated. If you pain continues to worsen or you are unable to urinate and develop abdominal bloating, please go to the emergency department for further evaluation and treatment.    A referral to urology has been placed, you should receive a call later today.  Please call their office if you have not heard from them by 2PM today.      ED Prescriptions    Medication Sig Dispense Auth. Provider   sulfamethoxazole-trimethoprim (BACTRIM DS) 800-160 MG tablet Take 1 tablet by mouth 2 (two) times daily for 7 days. 14 tablet Noe Gens, PA-C   phenazopyridine (PYRIDIUM) 95 MG tablet Take 1 tablet (95 mg total) by mouth 3 (three) times daily as needed for pain. 10 tablet Noe Gens, PA-C     PDMP not reviewed this encounter.   Noe Gens, [place] 06/14/20 1206

## 2020-06-16 LAB — URINE CULTURE
MICRO NUMBER:: 11083881
SPECIMEN QUALITY:: ADEQUATE

## 2020-11-29 ENCOUNTER — Encounter: Payer: Self-pay | Admitting: Cardiology

## 2020-11-29 ENCOUNTER — Ambulatory Visit: Payer: 59 | Admitting: Cardiology

## 2020-11-29 ENCOUNTER — Other Ambulatory Visit: Payer: Self-pay

## 2020-11-29 VITALS — BP 150/82 | HR 71 | Ht 67.0 in | Wt 194.0 lb

## 2020-11-29 DIAGNOSIS — Z8249 Family history of ischemic heart disease and other diseases of the circulatory system: Secondary | ICD-10-CM

## 2020-11-29 DIAGNOSIS — I350 Nonrheumatic aortic (valve) stenosis: Secondary | ICD-10-CM

## 2020-11-29 DIAGNOSIS — E7800 Pure hypercholesterolemia, unspecified: Secondary | ICD-10-CM | POA: Diagnosis not present

## 2020-11-29 NOTE — Progress Notes (Signed)
Cardiology Office Note:    Date:  11/29/2020   ID:  Cody Wilson, DOB 25-Apr-1961, MRN [id number]  PCP:  Aurea Graff.Marlou Sa, [place]  Cardiologist:  Candee Furbish, MD  Advanced Practice Provider:  No care team member to display Electrophysiologist:  None       Referring MD: Alroy Dust, L.Marlou Sa, MD     History of Present Illness:    Cody Wilson is a 60 y.o. male here for the follow-up of aortic stenosis.   Aortic valve gradients 2013-mean gradient 15 mmHg 2017-mean gradient 14 mmHg 2021-mean gradient 12 mmHg  Brother and sister have CAD.  He is a Dealer.  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  Quit smoking in 2014.  Works for [employer].  Other than arthritis and back neck and knees he feels well.  No chest pain no shortness of breath.  Medications compliant.  No myalgias on Vytorin.  History reviewed. No pertinent past medical history.  Past Surgical History:  Procedure Laterality Date  . SHOULDER SURGERY      Current Medications: Current Meds  Medication Sig  . diclofenac Sodium (VOLTAREN) 1 % GEL 4 grams  . esomeprazole (NEXIUM) 40 MG capsule Take 40 mg by mouth daily at 12 noon.   . ezetimibe-simvastatin (VYTORIN) 10-40 MG per tablet Take 1 tablet by mouth daily at 6 PM.   . meloxicam (MOBIC) 15 MG tablet Take 1 tablet by mouth daily as needed.  . Multiple Vitamins-Minerals (PRESERVISION AREDS) TABS See admin instructions.  . Omega-3 Fatty Acids (FISH OIL) 1000 MG CAPS Take 1 capsule by mouth daily.      Allergies:   Marcaine [bupivacaine hcl] and Other   Social History   Socioeconomic History  . Marital status: Married    Spouse name: Not on file  . Number of children: Not on file  . Years of education: Not on file  . Highest education level: Not on file  Occupational History  . Not on file  Tobacco Use  . Smoking status: Former Smoker    Types: Cigarettes  . Smokeless tobacco: Never Used  Vaping Use  .  Vaping Use: Never used  Substance and Sexual Activity  . Alcohol use: Yes    Alcohol/week: 0.0 standard drinks  . Drug use: No  . Sexual activity: Yes  Other Topics Concern  . Not on file  Social History Narrative  . Not on file   Social Determinants of Health   Financial Resource Strain: Not on file  Food Insecurity: Not on file  Transportation Needs: Not on file  Physical Activity: Not on file  Stress: Not on file  Social Connections: Not on file     Family History: The patient's family history includes Heart Problems in his father; Hypertension in his mother.  ROS:   Please see the history of present illness.     All other systems reviewed and are negative.  EKGs/Labs/Other Studies Reviewed:    The following studies were reviewed today:  ECHO 11/19/19:   1. Left ventricular ejection fraction, by estimation, is 55 to 60%. The  left ventricle has normal function. The left ventricle has no regional  wall motion abnormalities. There is mild left ventricular hypertrophy.  Left ventricular diastolic parameters  were normal.  2. Right ventricular systolic function is normal. The right ventricular  size is normal. Tricuspid regurgitation signal is inadequate for assessing  PA pressure.  3. Left atrial size was mildly  dilated.  4. The mitral valve is normal in structure. Trivial mitral valve  regurgitation. No evidence of mitral stenosis.  5. The aortic valve is tricuspid but may have fused left and right cusps.  Aortic valve regurgitation is not visualized. Mild aortic valve stenosis.  Aortic valve mean gradient measures 12.0 mmHg.  6. Aortic dilatation noted. There is mild dilatation of the ascending  aorta measuring 39 mm.  7. The inferior vena cava is normal in size with greater than 50%  respiratory variability, suggesting right atrial pressure of 3 mmHg.   EKG:  EKG is  ordered today.  The ekg ordered today demonstrates sinus rhythm 71 no other  abnormalities  Recent Labs: 06/14/2020: BUN 12; Creat 0.84; Potassium 3.7; Sodium 136  Recent Lipid Panel No results found for: CHOL, TRIG, HDL, CHOLHDL, VLDL, LDLCALC, LDLDIRECT   Risk Assessment/Calculations:      Physical Exam:    VS:  BP (!) 150/82   Pulse 71   Ht 5\' 7"  (1.702 m)   Wt 194 lb (88 kg)   SpO2 99%   BMI 30.38 kg/m     Wt Readings from Last 3 Encounters:  11/29/20 194 lb (88 kg)  06/14/20 185 lb (83.9 kg)  11/12/19 194 lb (88 kg)     GEN:  Well nourished, well developed in no acute distress HEENT: Normal NECK: No JVD; No carotid bruits LYMPHATICS: No lymphadenopathy CARDIAC: RRR, 2/6 SM,no rubs, gallops RESPIRATORY:  Clear to auscultation without rales, wheezing or rhonchi  ABDOMEN: Soft, non-tender, non-distended MUSCULOSKELETAL:  No edema; No deformity  SKIN: Warm and dry NEUROLOGIC:  Alert and oriented x 3 PSYCHIATRIC:  Normal affect   ASSESSMENT:    1. Aortic stenosis, mild   2. Family history of coronary arteriosclerosis   3. Pure hypercholesterolemia    PLAN:    In order of problems listed above:   Aortic stenosis -Continues to be mild.  No changes.  No need to repeat echocardiogram during this visit.  We will continue to monitor.  Murmur heard on exam. The aortic valve is tricuspid but may have fused left and right cusps.  Hyperlipidemia -LDL remains 71, excellent.  Outside labs 11/16/2020.  ALT 18, potassium 4.1, creatinine 0.9. -Continue with Vytorin.  No myalgias.  Elevated blood pressure without hypertension -Continue to monitor at home.  Arthritis -New start meloxicam.  Continue to monitor.     Medication Adjustments/Labs and Tests Ordered: Current medicines are reviewed at length with the patient today.  Concerns regarding medicines are outlined above.  No orders of the defined types were placed in this encounter.  No orders of the defined types were placed in this encounter.   Patient Instructions  Medication  Instructions:  Your physician recommends that you continue on your current medications as directed. Please refer to the Current Medication list given to you today.  *If you need a refill on your cardiac medications before your next appointment, please call your pharmacy*   Lab Work: none If you have labs (blood work) drawn today and your tests are completely normal, you will receive your results only by: Marland Kitchen MyChart Message (if you have MyChart) OR . A paper copy in the mail If you have any lab test that is abnormal or we need to change your treatment, we will call you to review the results.   Testing/Procedures: none   Follow-Up: At [facility], you and your health needs are our priority.  As part of our continuing mission to  provide you with exceptional heart care, we have created designated Provider Care Teams.  These Care Teams include your primary Cardiologist (physician) and Advanced Practice Providers (APPs -  Physician Assistants and Nurse Practitioners) who all work together to provide you with the care you need, when you need it.  We recommend signing up for the patient portal called "MyChart".  Sign up information is provided on this After Visit Summary.  MyChart is used to connect with patients for Virtual Visits (Telemedicine).  Patients are able to view lab/test results, encounter notes, upcoming appointments, etc.  Non-urgent messages can be sent to your provider as well.   To learn more about what you can do with MyChart, go to [email or web address].    Your next appointment:   12 month(s)  The format for your next appointment:   In Person  Provider:   You may see Candee Furbish, MD or one of the following Advanced Practice Providers on your designated Care Team:    Kathyrn Drown, NP    Other Instructions      Signed, Candee Furbish, MD  11/29/2020 10:28 AM    [place]

## 2020-11-29 NOTE — Patient Instructions (Signed)
Medication Instructions:  Your physician recommends that you continue on your current medications as directed. Please refer to the Current Medication list given to you today.  *If you need a refill on your cardiac medications before your next appointment, please call your pharmacy*   Lab Work: none If you have labs (blood work) drawn today and your tests are completely normal, you will receive your results only by: Marland Kitchen MyChart Message (if you have MyChart) OR . A paper copy in the mail If you have any lab test that is abnormal or we need to change your treatment, we will call you to review the results.   Testing/Procedures: none   Follow-Up: At [facility], you and your health needs are our priority.  As part of our continuing mission to provide you with exceptional heart care, we have created designated Provider Care Teams.  These Care Teams include your primary Cardiologist (physician) and Advanced Practice Providers (APPs -  Physician Assistants and Nurse Practitioners) who all work together to provide you with the care you need, when you need it.  We recommend signing up for the patient portal called "MyChart".  Sign up information is provided on this After Visit Summary.  MyChart is used to connect with patients for Virtual Visits (Telemedicine).  Patients are able to view lab/test results, encounter notes, upcoming appointments, etc.  Non-urgent messages can be sent to your provider as well.   To learn more about what you can do with MyChart, go to [email or web address].    Your next appointment:   12 month(s)  The format for your next appointment:   In Person  Provider:   You may see Candee Furbish, MD or one of the following Advanced Practice Providers on your designated Care Team:    Kathyrn Drown, NP    Other Instructions

## 2022-01-03 ENCOUNTER — Ambulatory Visit: Payer: 59 | Admitting: Cardiology

## 2022-03-10 ENCOUNTER — Ambulatory Visit: Payer: 59 | Admitting: Cardiology

## 2022-03-10 ENCOUNTER — Encounter: Payer: Self-pay | Admitting: Cardiology

## 2022-03-10 VITALS — BP 164/90 | HR 61 | Ht 67.0 in | Wt 192.0 lb

## 2022-03-10 DIAGNOSIS — I350 Nonrheumatic aortic (valve) stenosis: Secondary | ICD-10-CM | POA: Diagnosis not present

## 2022-03-10 DIAGNOSIS — Z8249 Family history of ischemic heart disease and other diseases of the circulatory system: Secondary | ICD-10-CM | POA: Diagnosis not present

## 2022-03-10 DIAGNOSIS — E7800 Pure hypercholesterolemia, unspecified: Secondary | ICD-10-CM

## 2022-03-10 NOTE — Patient Instructions (Signed)
Medication Instructions:  The current medical regimen is effective;  continue present plan and medications.  *If you need a refill on your cardiac medications before your next appointment, please call your pharmacy*  Follow-Up: At [facility], you and your health needs are our priority.  As part of our continuing mission to provide you with exceptional heart care, we have created designated Provider Care Teams.  These Care Teams include your primary Cardiologist (physician) and Advanced Practice Providers (APPs -  Physician Assistants and Nurse Practitioners) who all work together to provide you with the care you need, when you need it.  We recommend signing up for the patient portal called "MyChart".  Sign up information is provided on this After Visit Summary.  MyChart is used to connect with patients for Virtual Visits (Telemedicine).  Patients are able to view lab/test results, encounter notes, upcoming appointments, etc.  Non-urgent messages can be sent to your provider as well.   To learn more about what you can do with MyChart, go to [email or web address].    Your next appointment:   1 year(s)  The format for your next appointment:   In Person  Provider:   Marco Skains, MD {   Important Information About Sugar       

## 2022-03-10 NOTE — Progress Notes (Signed)
Cardiology Office Note:    Date:  03/10/2022   ID:  [name], DOB 02/17/1961, MRN [id number]  PCP:  Aurea Graff.Marlou Sa, [place]  Cardiologist:  Candee Furbish, MD  Advanced Practice Provider:  No care team member to display Electrophysiologist:  None       Referring MD: Alroy Dust, L.Marlou Sa, MD    History of Present Illness:    Cody Wilson is a 61 y.o. male here for the follow-up of aortic stenosis and hyperlipidemia.   He followed up with his PCP on 03/08/2022. His recent labs were reviewed and showed Hgb 15.5, Creatine 0.91, LDL 93.  Aortic valve gradients 2013-mean gradient 15 mmHg 2017-mean gradient 14 mmHg 2021-mean gradient 12 mmHg  In 2009 carotid Doppler showed mild plaque.  Brother and sister have CAD.  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    At his last appointment he was feeling well aside from arthritis (back, neck, knees). No myalgias on Vytorin. Murmur was heard on exam. The aortic valve is tricuspid but may have fused left and right cusps. His LDL remained 71, excellent.  Outside labs 11/16/2020: ALT 18, potassium 4.1, creatinine 0.9.  Today: He states he is feeling good aside from some generic pains that he would attribute to his age. He is staying active as he is back at work. 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    Per his smart watch his blood pressure was good this morning. In clinic today his BP was initially 140/90, and on manual recheck was 164/90.  For exercise he is still riding his bike and working on weight loss. He has a goal of reaching 184 lbs, currently 192 lbs in clinic. At his last physical with his PCP his blood pressure was found to be 124/78.  To help avoid sugary drinks such as sodas he has been trying probiotic drinks.  He denies any palpitations, chest pain, shortness of breath, or peripheral edema. No lightheadedness, headaches,  syncope, orthopnea, or PND.   No past medical history on file.  Past Surgical History:  Procedure Laterality Date   SHOULDER SURGERY      Current Medications: Current Meds  Medication Sig   esomeprazole (NEXIUM) 40 MG capsule Take 40 mg by mouth daily at 12 noon.    ezetimibe-simvastatin (VYTORIN) 10-40 MG per tablet Take 1 tablet by mouth daily at 6 PM.    Multiple Vitamins-Minerals (PRESERVISION AREDS) TABS See admin instructions.   Omega-3 Fatty Acids (FISH OIL) 1000 MG CAPS Take 1 capsule by mouth daily.      Allergies:   Marcaine [bupivacaine hcl] and Other   Social History   Socioeconomic History   Marital status: Married    Spouse name: Not on file   Number of children: Not on file   Years of education: Not on file   Highest education level: Not on file  Occupational History   Not on file  Tobacco Use   Smoking status: Former    Types: Cigarettes   Smokeless tobacco: Never  Vaping Use   Vaping Use: Never used  Substance and Sexual Activity   Alcohol use: Yes    Alcohol/week: 0.0 standard drinks of alcohol   Drug use: No   Sexual activity: Yes  Other Topics Concern   Not on file  Social History Narrative   Not on file   Social Determinants of  Health   Financial Resource Strain: Not on file  Food Insecurity: Not on file  Transportation Needs: Not on file  Physical Activity: Not on file  Stress: Not on file  Social Connections: Not on file     Family History: The patient's family history includes Heart Problems in his father; Hypertension in his mother.  ROS:   Please see the history of present illness.    All other systems reviewed and are negative.  EKGs/Labs/Other Studies Reviewed:    The following studies were reviewed today:  ECHO 11/19/19:  1. Left ventricular ejection fraction, by estimation, is 55 to 60%. The  left ventricle has normal function. The left ventricle has no regional  wall motion abnormalities. There is mild left  ventricular hypertrophy.  Left ventricular diastolic parameters  were normal.   2. Right ventricular systolic function is normal. The right ventricular  size is normal. Tricuspid regurgitation signal is inadequate for assessing  PA pressure.   3. Left atrial size was mildly dilated.   4. The mitral valve is normal in structure. Trivial mitral valve  regurgitation. No evidence of mitral stenosis.   5. The aortic valve is tricuspid but may have fused left and right cusps.  Aortic valve regurgitation is not visualized. Mild aortic valve stenosis.  Aortic valve mean gradient measures 12.0 mmHg.   6. Aortic dilatation noted. There is mild dilatation of the ascending  aorta measuring 39 mm.   7. The inferior vena cava is normal in size with greater than 50%  respiratory variability, suggesting right atrial pressure of 3 mmHg.   EKG:  EKG is personally reviewed. 03/10/2022:  Sinus rhythm. Rate 61 bpm. 11/29/2020: sinus rhythm 71 no other abnormalities  Recent Labs: No results found for requested labs within last 365 days.   Recent Lipid Panel No results found for: "CHOL", "TRIG", "HDL", "CHOLHDL", "VLDL", "LDLCALC", "LDLDIRECT"   Risk Assessment/Calculations:      Physical Exam:    VS:  BP (!) 164/90 (BP Location: Left Arm, Patient Position: Sitting, Cuff Size: Normal)   Pulse 61   Ht '5\' 7"'$  (1.702 m)   Wt 192 lb (87.1 kg)   SpO2 97%   BMI 30.07 kg/m     Wt Readings from Last 3 Encounters:  03/10/22 192 lb (87.1 kg)  11/29/20 194 lb (88 kg)  06/14/20 185 lb (83.9 kg)     GEN:  Well nourished, well developed in no acute distress HEENT: Normal NECK: No JVD; No carotid bruits LYMPHATICS: No lymphadenopathy CARDIAC: RRR, 2/6 SM, no rubs, no gallops RESPIRATORY:  Clear to auscultation without rales, wheezing or rhonchi  ABDOMEN: Soft, non-tender, non-distended MUSCULOSKELETAL:  No edema; No deformity  SKIN: Warm and dry NEUROLOGIC:  Alert and oriented x 3 PSYCHIATRIC:   Normal affect   ASSESSMENT:    1. Aortic stenosis, mild   2. Pure hypercholesterolemia   3. Family history of coronary arteriosclerosis     PLAN:    In order of problems listed above:  No problem-specific Assessment & Plan notes found for this encounter.   Follow-up:  1 year.  Medication Adjustments/Labs and Tests Ordered: Current medicines are reviewed at length with the patient today.  Concerns regarding medicines are outlined above.   Orders Placed This Encounter  Procedures   EKG 12-Lead   No orders of the defined types were placed in this encounter.  Patient Instructions  Medication Instructions:  The current medical regimen is effective;  continue present plan and medications.  *  If you need a refill on your cardiac medications before your next appointment, please call your pharmacy*  Follow-Up: At [facility], you and your health needs are our priority.  As part of our continuing mission to provide you with exceptional heart care, we have created designated Provider Care Teams.  These Care Teams include your primary Cardiologist (physician) and Advanced Practice Providers (APPs -  Physician Assistants and Nurse Practitioners) who all work together to provide you with the care you need, when you need it.  We recommend signing up for the patient portal called "MyChart".  Sign up information is provided on this After Visit Summary.  MyChart is used to connect with patients for Virtual Visits (Telemedicine).  Patients are able to view lab/test results, encounter notes, upcoming appointments, etc.  Non-urgent messages can be sent to your provider as well.   To learn more about what you can do with MyChart, go to [email or web address].    Your next appointment:   1 year(s)  The format for your next appointment:   In Person  Provider:   Candee Furbish, MD {    Important Information About Sugar         I,Mathew Stumpf,acting as a scribe for Candee Furbish,  MD.,have documented all relevant documentation on the behalf of Candee Furbish, MD,as directed by  Candee Furbish, MD while in the presence of Candee Furbish, MD.  I, Candee Furbish, MD, have reviewed all documentation for this visit. The documentation on 03/10/22 for the exam, diagnosis, procedures, and orders are all accurate and complete.   Signed, Candee Furbish, MD  03/10/2022 9:38 AM    [facility]

## 2022-10-19 DIAGNOSIS — R3915 Urgency of urination: Secondary | ICD-10-CM | POA: Diagnosis not present

## 2022-10-19 DIAGNOSIS — N401 Enlarged prostate with lower urinary tract symptoms: Secondary | ICD-10-CM | POA: Diagnosis not present

## 2022-11-10 DIAGNOSIS — Z09 Encounter for follow-up examination after completed treatment for conditions other than malignant neoplasm: Secondary | ICD-10-CM | POA: Diagnosis not present

## 2022-11-10 DIAGNOSIS — Z86010 Personal history of colonic polyps: Secondary | ICD-10-CM | POA: Diagnosis not present

## 2022-11-10 DIAGNOSIS — D175 Benign lipomatous neoplasm of intra-abdominal organs: Secondary | ICD-10-CM | POA: Diagnosis not present

## 2022-11-10 DIAGNOSIS — D124 Benign neoplasm of descending colon: Secondary | ICD-10-CM | POA: Diagnosis not present

## 2023-01-05 DIAGNOSIS — M25512 Pain in left shoulder: Secondary | ICD-10-CM | POA: Diagnosis not present

## 2023-01-05 DIAGNOSIS — E7800 Pure hypercholesterolemia, unspecified: Secondary | ICD-10-CM | POA: Diagnosis not present

## 2023-01-05 DIAGNOSIS — K219 Gastro-esophageal reflux disease without esophagitis: Secondary | ICD-10-CM | POA: Diagnosis not present

## 2023-01-05 DIAGNOSIS — Z0000 Encounter for general adult medical examination without abnormal findings: Secondary | ICD-10-CM | POA: Diagnosis not present

## 2023-01-05 DIAGNOSIS — M1990 Unspecified osteoarthritis, unspecified site: Secondary | ICD-10-CM | POA: Diagnosis not present

## 2023-04-26 DIAGNOSIS — J069 Acute upper respiratory infection, unspecified: Secondary | ICD-10-CM | POA: Diagnosis not present

## 2023-07-13 ENCOUNTER — Ambulatory Visit: Payer: Medicaid Other | Attending: Cardiology | Admitting: Cardiology

## 2023-07-13 ENCOUNTER — Encounter: Payer: Self-pay | Admitting: Cardiology

## 2023-07-13 VITALS — BP 158/96 | HR 76 | Ht 67.0 in | Wt 196.0 lb

## 2023-07-13 DIAGNOSIS — I350 Nonrheumatic aortic (valve) stenosis: Secondary | ICD-10-CM

## 2023-07-13 DIAGNOSIS — E7800 Pure hypercholesterolemia, unspecified: Secondary | ICD-10-CM | POA: Diagnosis not present

## 2023-07-13 NOTE — Patient Instructions (Signed)
 Medication Instructions:  The current medical regimen is effective;  continue present plan and medications.  *If you need a refill on your cardiac medications before your next appointment, please call your pharmacy*  Testing/Procedures: Your physician has requested that you have an echocardiogram. Echocardiography is a painless test that uses sound waves to create images of your heart. It provides your doctor with information about the size and shape of your heart and how well your heart's chambers and valves are working. This procedure takes approximately one hour. There are no restrictions for this procedure. Please do NOT wear cologne, perfume, aftershave, or lotions (deodorant is allowed). Please arrive 15 minutes prior to your appointment time.  Please note: We ask at that you not bring children with you during ultrasound (echo/ vascular) testing. Due to room size and safety concerns, children are not allowed in the ultrasound rooms during exams. Our front office staff cannot provide observation of children in our lobby area while testing is being conducted. An adult accompanying a patient to their appointment will only be allowed in the ultrasound room at the discretion of the ultrasound technician under special circumstances. We apologize for any inconvenience.   Follow-Up: At [facility], you and your health needs are our priority.  As part of our continuing mission to provide you with exceptional heart care, we have created designated Provider Care Teams.  These Care Teams include your primary Cardiologist (physician) and Advanced Practice Providers (APPs -  Physician Assistants and Nurse Practitioners) who all work together to provide you with the care you need, when you need it.  We recommend signing up for the patient portal called "MyChart".  Sign up information is provided on this After Visit Summary.  MyChart is used to connect with patients for Virtual Visits (Telemedicine).   Patients are able to view lab/test results, encounter notes, upcoming appointments, etc.  Non-urgent messages can be sent to your provider as well.   To learn more about what you can do with MyChart, go to [email or web address].    Your next appointment:   1 year(s)  Provider:   Donato Schultz, MD

## 2023-07-13 NOTE — Progress Notes (Signed)
Cardiology Office Note:  .   Date:  07/13/2023  ID:  Cody Wilson, DOB 14-Jul-1961, MRN [id number] PCP: Asencion Gowda.August Saucer, MD (Inactive)  [facility] Providers Cardiologist:  Donato Schultz, MD     History of Present Illness: Cody Wilson   Cody Wilson is a 62 y.o. male Discussed with the use of AI scribe   History of Present Illness   A 62 year old patient with a history of mild aortic stenosis, mild carotid plaque, and a former smoker, presents for a follow-up visit. The patient's aortic valve mean gradients have been stable and mild over the past eight years. The patient reports no symptoms of chest pain or shortness of breath. He has noticed a murmur-like sound, particularly noticeable at night, which he attributes to his known aortic valve regurgitation.  The patient has recently retired from a physically demanding job due to various musculoskeletal complaints, including arthritis in the knees, fingers, and neck. He continues to work part-time in a less physically demanding role. The patient maintains an active lifestyle, including regular walking and cycling.  The patient has recently been treated for a sinus infection, which has caused some discomfort and difficulty sleeping. He is currently taking multiple medications, including those for acid reflux and cholesterol management. The patient has noticed a recent increase in his blood pressure, which he attributes to his current medication regimen and recent illness. He expresses a reluctance to add any additional medications to his current regimen.  The patient maintains a healthy diet, primarily cooking with olive oil and avoiding processed foods. He reports adherence to his prescribed medications, including Vytorin 10/40mg  for cholesterol management.        Studies Reviewed: Cody Wilson   EKG Interpretation Date/Time:  Friday July 13 2023 10:10:18 EST Ventricular Rate:  76 PR Interval:  172 QRS Duration:  86 QT Interval:  368 QTC  Calculation: 414 R Axis:   1  Text Interpretation: Normal sinus rhythm Non-specific ST-t changes No previous ECGs available Confirmed by Donato Schultz ([id number]) on 07/13/2023 10:13:35 AM    Results LABS LDL: 86 mg/dL Creatinine: 0.8 mg/dL Hemoglobin: 29.5 g/dL  DIAGNOSTIC Aortic valve mean gradient: 15 mmHg (2013) Aortic valve mean gradient: 14 mmHg (2017) Aortic valve mean gradient: 12 mmHg (2021) EKG: Normal, NSSTW changes  Risk Assessment/Calculations:           Physical Exam:   VS:  BP (!) 158/96   Pulse 76   Ht 5\' 7"  (1.702 m)   Wt 196 lb (88.9 kg)   SpO2 98%   BMI 30.70 kg/m    Wt Readings from Last 3 Encounters:  07/13/23 196 lb (88.9 kg)  03/10/22 192 lb (87.1 kg)  11/29/20 194 lb (88 kg)    GEN: Well nourished, well developed in no acute distress NECK: No JVD; No carotid bruits CARDIAC: RRR, 2/6 systolic murmur, no rubs, no gallops RESPIRATORY:  Clear to auscultation without rales, wheezing or rhonchi  ABDOMEN: Soft, non-tender, non-distended EXTREMITIES:  No edema; No deformity   ASSESSMENT AND PLAN: .    Assessment and Plan    Aortic Stenosis Chronic mild aortic stenosis with stable mean gradients over the past decade (2013: 15 mmHg, 2017: 14 mmHg, 2021: 12 mmHg). No current symptoms of dyspnea, angina, or syncope. EKG unremarkable. Reports nocturnal murmur possibly related to aortic valve. - Order echocardiogram to reassess aortic valve status - Schedule follow-up in one year  Sinus Infection Acute sinusitis ongoing for ten days with significant congestion, nasal obstruction, and  cephalic pressure. Currently on antibiotics and Mucinex. Discussed symptom duration and importance of completing antibiotics. - Continue current antibiotic regimen - Continue Mucinex  Hypertension Elevated blood pressure (150/90 mmHg). No prior history of hypertension. Possible contributing factors include current sinusitis. Discussed home blood pressure monitoring and  reporting persistent hypertension. - Monitor blood pressure at home - Report persistent high readings to primary care physician  Hyperlipidemia Chronic hyperlipidemia managed with Vytorin 10/40 mg. Recent LDL level 86 mg/dL, indicating good control. Discussed continued medication adherence and regular lipid monitoring. - Continue Vytorin 10/40 mg  General Health Maintenance Engages in regular physical activity (walking, stationary biking). Adheres to Mediterranean diet. Upcoming physical exam next year. Discussed benefits of continued physical activity and dietary habits. - Encourage continued physical activity - Maintain Mediterranean diet  Follow-up - Schedule echocardiogram before year-end - Follow-up appointment in one year.               Signed, Donato Schultz, MD

## 2023-08-17 ENCOUNTER — Ambulatory Visit (HOSPITAL_COMMUNITY): Payer: Medicaid Other | Attending: Cardiology

## 2023-08-17 DIAGNOSIS — I350 Nonrheumatic aortic (valve) stenosis: Secondary | ICD-10-CM | POA: Insufficient documentation

## 2023-08-17 LAB — ECHOCARDIOGRAM COMPLETE
AR max vel: 1.57 cm2
AV Area VTI: 1.73 cm2
AV Area mean vel: 1.55 cm2
AV Mean grad: 12.5 mm[Hg]
AV Peak grad: 23.6 mm[Hg]
Ao pk vel: 2.43 m/s
Area-P 1/2: 3.54 cm2
S' Lateral: 3 cm

## 2023-11-22 ENCOUNTER — Ambulatory Visit
Admission: EM | Admit: 2023-11-22 | Discharge: 2023-11-22 | Disposition: A | Discharge status: Home / Self Care | Attending: Family Medicine | Admitting: Family Medicine

## 2023-11-22 DIAGNOSIS — J0100 Acute maxillary sinusitis, unspecified: Secondary | ICD-10-CM | POA: Diagnosis not present

## 2023-11-22 DIAGNOSIS — S161XXA Strain of muscle, fascia and tendon at neck level, initial encounter: Secondary | ICD-10-CM | POA: Diagnosis not present

## 2023-11-22 MED ORDER — AMOXICILLIN-POT CLAVULANATE 875-125 MG PO TABS: 1.0000 | ORAL_TABLET | Freq: Two times a day (BID) | ORAL | 0 refills | Status: AC

## 2023-11-22 MED ORDER — PREDNISONE 10 MG (21) PO TBPK: ORAL_TABLET | Freq: Every day | ORAL | 0 refills | Status: AC

## 2023-11-22 NOTE — Discharge Instructions (Addendum)
 Advised patient to take medications as directed with food to completion.  Advised patient to take prednisone with first dose of Augmentin until complete.  Encouraged to increase daily water intake to 64 ounces per day while taking these medications.  Advised if symptoms worsen and/or unresolved please follow-up with your PCP or here for further evaluation.

## 2023-11-22 NOTE — ED Triage Notes (Signed)
 Pt c/o waking up with dizziness this am. No previous hx of dizziness or vertigo. Some LT sided neck pain as well, not sure if related.

## 2023-11-22 NOTE — ED Provider Notes (Signed)
 [facility]    CSN: [id number] Arrival date & time: 11/22/23  0809      History   Chief Complaint Chief Complaint  Patient presents with   Dizziness    HPI Cody Wilson is a 63 y.o. male.   HPI 63 year old male presents with dizziness and left-sided neck pain that began this morning.  PMH significant for former smoker, HLD, and aortic stenosis.  Patient is accompanied by his wife this morning.  History reviewed. No pertinent past medical history.  Patient Active Problem List   Diagnosis Date Noted   Hyperlipidemia 06/08/2014   Family history of coronary arteriosclerosis 06/08/2014   Former smoker 06/08/2014    Past Surgical History:  Procedure Laterality Date   SHOULDER SURGERY         Home Medications    Prior to Admission medications   Medication Sig Start Date End Date Taking? Authorizing Provider  amoxicillin-clavulanate (AUGMENTIN) 875-125 MG tablet Take 1 tablet by mouth every 12 (twelve) hours. 11/22/23  Yes Trevor Iha, FNP  predniSONE (STERAPRED UNI-PAK 21 TAB) 10 MG (21) TBPK tablet Take by mouth daily. Take 6 tabs by mouth daily  for 2 days, then 5 tabs for 2 days, then 4 tabs for 2 days, then 3 tabs for 2 days, 2 tabs for 2 days, then 1 tab by mouth daily for 2 days 11/22/23  Yes Trevor Iha, FNP  esomeprazole (NEXIUM) 40 MG capsule Take 40 mg by mouth daily at 12 noon.     [provider]  ezetimibe-simvastatin (VYTORIN) 10-40 MG per tablet Take 1 tablet by mouth daily at 6 PM.     [provider]  Multiple Vitamins-Minerals (PRESERVISION AREDS) TABS See admin instructions.    [provider]  Omega-3 Fatty Acids (FISH OIL) 1000 MG CAPS Take 1 capsule by mouth daily.     [provider]    Family History Family History  Problem Relation Age of Onset   Heart Problems Father    Hypertension Mother     Social History Social History   Tobacco Use   Smoking status: Former    Types: Cigarettes    Smokeless tobacco: Never  Vaping Use   Vaping status: Never Used  Substance Use Topics   Alcohol use: Yes    Alcohol/week: 0.0 standard drinks of alcohol   Drug use: No     Allergies   Marcaine [bupivacaine hcl] and Other   Review of Systems Review of Systems  HENT:  Positive for congestion.   Neurological:  Positive for dizziness.  All other systems reviewed and are negative.    Physical Exam Triage Vital Signs ED Triage Vitals [11/22/23 0822]  Encounter Vitals Group     BP      Systolic BP Percentile      Diastolic BP Percentile      Pulse      Resp      Temp      Temp src      SpO2      Weight      Height      Head Circumference      Peak Flow      Pain Score 2     Pain Loc      Pain Education      Exclude from Growth Chart    No data found.  Updated Vital Signs BP (!) 154/82 (BP Location: Right Arm)   Pulse (!) 57   Temp 97.7 F (  36.5 C) (Oral)   Resp 17   SpO2 98%     Physical Exam Vitals and nursing note reviewed.  Constitutional:      Appearance: Normal appearance. He is normal weight. He is ill-appearing.  HENT:     Head: Normocephalic and atraumatic.     Right Ear: Tympanic membrane and external ear normal.     Left Ear: Tympanic membrane and external ear normal.     Ears:     Comments: Significant eustachian tube dysfunction noted bilaterally    Nose:     Comments: Turbinates are erythematous/edematous    Mouth/Throat:     Mouth: Mucous membranes are moist.     Pharynx: Oropharynx is clear.  Eyes:     Extraocular Movements: Extraocular movements intact.     Conjunctiva/sclera: Conjunctivae normal.     Pupils: Pupils are equal, round, and reactive to light.  Neck:     Vascular: No carotid bruit.     Comments: No JVD, no bruit Cardiovascular:     Rate and Rhythm: Normal rate and regular rhythm.     Pulses: Normal pulses.     Heart sounds: Normal heart sounds.  Pulmonary:     Effort: Pulmonary effort is normal.     Breath  sounds: Normal breath sounds. No wheezing, rhonchi or rales.  Musculoskeletal:        General: Normal range of motion.     Cervical back: Normal range of motion and neck supple. Tenderness present.     Comments: Posterior cervical spine (inferior aspect left sided): TTP over paraspinous processes/paraspinous muscles, no deformity noted  Skin:    General: Skin is warm and dry.  Neurological:     General: No focal deficit present.     Mental Status: He is alert and oriented to person, place, and time. Mental status is at baseline.  Psychiatric:        Mood and Affect: Mood normal.        Behavior: Behavior normal.      UC Treatments / Results  Labs (all labs ordered are listed, but only abnormal results are displayed) Labs Reviewed - No data to display  EKG   Radiology No results found.  Procedures Procedures (including critical care time)  Medications Ordered in UC Medications - No data to display  Initial Impression / Assessment and Plan / UC Course  I have reviewed the triage vital signs and the nursing notes.  Pertinent labs & imaging results that were available during my care of the patient were reviewed by me and considered in my medical decision making (see chart for details).     MDM: 1.  Acute maxillary sinusitis, recurrence not specified-Rx'd Augmentin 875/125 mg tablet: Take 1 tablet twice daily x 7 days; 2.  Strain of neck muscle, initial encounter-Rx'd Sterapred Unipak (21 tab 10 mg. Advised patient to take medications as directed with food to completion.  Advised patient to take prednisone with first dose of Augmentin until complete.  Encouraged to increase daily water intake to 64 ounces per day while taking these medications.  Advised if symptoms worsen and/or unresolved please follow-up with your PCP or here for further evaluation.  Patient discharged home, hemodynamically stable. Final Clinical Impressions(s) / UC Diagnoses   Final diagnoses:  Acute  maxillary sinusitis, recurrence not specified  Strain of neck muscle, initial encounter     Discharge Instructions      Advised patient to take medications as directed with food to completion.  Advised patient to take prednisone with first dose of Augmentin until complete.  Encouraged to increase daily water intake to 64 ounces per day while taking these medications.  Advised if symptoms worsen and/or unresolved please follow-up with your PCP or here for further evaluation.     ED Prescriptions     Medication Sig Dispense Auth. Provider   amoxicillin-clavulanate (AUGMENTIN) 875-125 MG tablet Take 1 tablet by mouth every 12 (twelve) hours. 14 tablet Trevor Iha, FNP   predniSONE (STERAPRED UNI-PAK 21 TAB) 10 MG (21) TBPK tablet Take by mouth daily. Take 6 tabs by mouth daily  for 2 days, then 5 tabs for 2 days, then 4 tabs for 2 days, then 3 tabs for 2 days, 2 tabs for 2 days, then 1 tab by mouth daily for 2 days 42 tablet Trevor Iha, FNP      PDMP not reviewed this encounter.   Trevor Iha, FNP 11/22/23 [phone]
